# Patient Record
Sex: Female | Born: 1995 | Race: Black or African American | Hispanic: No | Smoking: Former smoker
Health system: Southern US, Community
[De-identification: ages and names within clinical notes are randomized; demographics above are authoritative.]

## PROBLEM LIST (undated history)

## (undated) DIAGNOSIS — Z8619 Personal history of other infectious and parasitic diseases: Secondary | ICD-10-CM

## (undated) DIAGNOSIS — F419 Anxiety disorder, unspecified: Secondary | ICD-10-CM

## (undated) DIAGNOSIS — F32A Depression, unspecified: Secondary | ICD-10-CM

## (undated) DIAGNOSIS — E739 Lactose intolerance, unspecified: Secondary | ICD-10-CM

## (undated) DIAGNOSIS — A64 Unspecified sexually transmitted disease: Secondary | ICD-10-CM

## (undated) DIAGNOSIS — H8110 Benign paroxysmal vertigo, unspecified ear: Secondary | ICD-10-CM

## (undated) DIAGNOSIS — J329 Chronic sinusitis, unspecified: Secondary | ICD-10-CM

## (undated) DIAGNOSIS — F329 Major depressive disorder, single episode, unspecified: Secondary | ICD-10-CM

## (undated) DIAGNOSIS — A609 Anogenital herpesviral infection, unspecified: Secondary | ICD-10-CM

## (undated) HISTORY — DX: Depression, unspecified: F32.A

## (undated) HISTORY — DX: Benign paroxysmal vertigo, unspecified ear: H81.10

## (undated) HISTORY — DX: Anogenital herpesviral infection, unspecified: A60.9

## (undated) HISTORY — DX: Unspecified sexually transmitted disease: A64

## (undated) HISTORY — DX: Personal history of other infectious and parasitic diseases: Z86.19

## (undated) HISTORY — PX: TOOTH EXTRACTION: SUR596

---
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
## 2014-02-20 ENCOUNTER — Emergency Department (HOSPITAL_COMMUNITY)
Admission: EM | Admit: 2014-02-20 | Discharge: 2014-02-20 | Disposition: A | Discharge status: Home / Self Care | Payer: Medicaid Other | Attending: Emergency Medicine | Admitting: Emergency Medicine

## 2014-02-20 ENCOUNTER — Encounter (HOSPITAL_COMMUNITY): Payer: Self-pay | Admitting: Emergency Medicine

## 2014-02-20 DIAGNOSIS — N390 Urinary tract infection, site not specified: Secondary | ICD-10-CM | POA: Insufficient documentation

## 2014-02-20 DIAGNOSIS — Z202 Contact with and (suspected) exposure to infections with a predominantly sexual mode of transmission: Secondary | ICD-10-CM | POA: Diagnosis present

## 2014-02-20 DIAGNOSIS — Z8709 Personal history of other diseases of the respiratory system: Secondary | ICD-10-CM | POA: Insufficient documentation

## 2014-02-20 DIAGNOSIS — Z7951 Long term (current) use of inhaled steroids: Secondary | ICD-10-CM | POA: Diagnosis not present

## 2014-02-20 DIAGNOSIS — M791 Myalgia: Secondary | ICD-10-CM | POA: Diagnosis not present

## 2014-02-20 DIAGNOSIS — Z720 Tobacco use: Secondary | ICD-10-CM | POA: Diagnosis not present

## 2014-02-20 DIAGNOSIS — Z79899 Other long term (current) drug therapy: Secondary | ICD-10-CM | POA: Diagnosis not present

## 2014-02-20 DIAGNOSIS — Z8639 Personal history of other endocrine, nutritional and metabolic disease: Secondary | ICD-10-CM | POA: Diagnosis not present

## 2014-02-20 DIAGNOSIS — Z880 Allergy status to penicillin: Secondary | ICD-10-CM | POA: Diagnosis not present

## 2014-02-20 DIAGNOSIS — N739 Female pelvic inflammatory disease, unspecified: Secondary | ICD-10-CM | POA: Insufficient documentation

## 2014-02-20 DIAGNOSIS — Z3202 Encounter for pregnancy test, result negative: Secondary | ICD-10-CM | POA: Diagnosis not present

## 2014-02-20 DIAGNOSIS — N730 Acute parametritis and pelvic cellulitis: Secondary | ICD-10-CM

## 2014-02-20 LAB — HIV ANTIBODY (ROUTINE TESTING W REFLEX): HIV: NONREACTIVE

## 2014-02-20 LAB — URINALYSIS, ROUTINE W REFLEX MICROSCOPIC
Bilirubin Urine: NEGATIVE
GLUCOSE, UA: NEGATIVE mg/dL
Ketones, ur: NEGATIVE mg/dL
Nitrite: NEGATIVE
PH: 5.5 (ref 5.0–8.0)
PROTEIN: NEGATIVE mg/dL
Specific Gravity, Urine: 1.02 (ref 1.005–1.030)
Urobilinogen, UA: 0.2 mg/dL (ref 0.0–1.0)

## 2014-02-20 LAB — COMPREHENSIVE METABOLIC PANEL
ALT: 18 U/L (ref 0–35)
AST: 20 U/L (ref 0–37)
Albumin: 3.8 g/dL (ref 3.5–5.2)
Alkaline Phosphatase: 69 U/L (ref 39–117)
Anion gap: 13 (ref 5–15)
BUN: 11 mg/dL (ref 6–23)
CALCIUM: 9.3 mg/dL (ref 8.4–10.5)
CO2: 24 mEq/L (ref 19–32)
Chloride: 101 mEq/L (ref 96–112)
Creatinine, Ser: 0.81 mg/dL (ref 0.50–1.10)
GFR calc non Af Amer: >90 mL/min (ref >90)
Glucose, Bld: 87 mg/dL (ref 70–99)
Potassium: 4 mEq/L (ref 3.7–5.3)
SODIUM: 138 meq/L (ref 137–147)
TOTAL PROTEIN: 7.8 g/dL (ref 6.0–8.3)
Total Bilirubin: 0.3 mg/dL (ref 0.3–1.2)

## 2014-02-20 LAB — WET PREP, GENITAL
Trich, Wet Prep: NONE SEEN
Yeast Wet Prep HPF POC: NONE SEEN

## 2014-02-20 LAB — CBC WITH DIFFERENTIAL/PLATELET
Basophils Absolute: 0.1 10*3/uL (ref 0.0–0.1)
Basophils Relative: 1 % (ref 0–1)
EOS ABS: 0 10*3/uL (ref 0.0–0.7)
Eosinophils Relative: 0 % (ref 0–5)
HCT: 39.3 % (ref 36.0–46.0)
Hemoglobin: 13.9 g/dL (ref 12.0–15.0)
Lymphocytes Relative: 15 % (ref 12–46)
Lymphs Abs: 1 10*3/uL (ref 0.7–4.0)
MCH: 29.8 pg (ref 26.0–34.0)
MCHC: 35.4 g/dL (ref 30.0–36.0)
MCV: 84.3 fL (ref 78.0–100.0)
Monocytes Absolute: 0.5 10*3/uL (ref 0.1–1.0)
Monocytes Relative: 7 % (ref 3–12)
NEUTROS PCT: 77 % (ref 43–77)
Neutro Abs: 5.1 10*3/uL (ref 1.7–7.7)
Platelets: 160 10*3/uL (ref 150–400)
RBC: 4.66 MIL/uL (ref 3.87–5.11)
RDW: 13.3 % (ref 11.5–15.5)
WBC: 6.7 10*3/uL (ref 4.0–10.5)

## 2014-02-20 LAB — URINE MICROSCOPIC-ADD ON

## 2014-02-20 LAB — LIPASE, BLOOD: LIPASE: 22 U/L (ref 11–59)

## 2014-02-20 LAB — POC URINE PREG, ED: PREG TEST UR: NEGATIVE

## 2014-02-20 LAB — RPR

## 2014-02-20 MED ORDER — LIDOCAINE HCL 2 % IJ SOLN
INTRAMUSCULAR | Status: AC
  Administered 2014-02-20: 42 mg
  Filled 2014-02-20: qty 20

## 2014-02-20 MED ORDER — DOXYCYCLINE HYCLATE 100 MG PO CAPS: 100.0000 mg | ORAL_CAPSULE | Freq: Two times a day (BID) | ORAL | Status: DC

## 2014-02-20 MED ORDER — ACETAMINOPHEN 325 MG PO TABS
650.0000 mg | ORAL_TABLET | Freq: Four times a day (QID) | ORAL | Status: DC | PRN
Start: 2014-02-20 — End: 2014-02-20
  Administered 2014-02-20: 650 mg via ORAL
  Filled 2014-02-20: qty 2

## 2014-02-20 MED ORDER — DOXYCYCLINE HYCLATE 100 MG PO TABS
100.0000 mg | ORAL_TABLET | Freq: Once | ORAL | Status: AC
  Administered 2014-02-20: 100 mg via ORAL
  Filled 2014-02-20: qty 1

## 2014-02-20 MED ORDER — CEFTRIAXONE SODIUM 250 MG IJ SOLR
250.0000 mg | Freq: Once | INTRAMUSCULAR | Status: AC
  Administered 2014-02-20: 250 mg via INTRAMUSCULAR
  Filled 2014-02-20: qty 250

## 2014-02-20 MED ORDER — SODIUM CHLORIDE 0.9 % IV BOLUS (SEPSIS): 1000.0000 mL | Freq: Once | INTRAVENOUS | Status: DC

## 2014-02-20 MED ORDER — IBUPROFEN 800 MG PO TABS: 800.0000 mg | ORAL_TABLET | Freq: Three times a day (TID) | ORAL | Status: DC

## 2014-02-20 MED ORDER — CEPHALEXIN 500 MG PO CAPS: 500.0000 mg | ORAL_CAPSULE | Freq: Four times a day (QID) | ORAL | Status: DC

## 2014-02-20 NOTE — ED Notes (Signed)
Patient medicated for fever, see MAR Patient stated to this nurse that she wants to be tested for STDs Patient's boyfriend is at the bedside--patient states that he is aware and that he is supposed to be tested as well Patient ambulatory to bathroom for urine specimen

## 2014-02-20 NOTE — Discharge Instructions (Signed)
Ibuprofen and tylenol for fever. Drink plenty of fluids. Doxycycline for pelvic infection until all gone. Keflex for bladder infection until all gone. Follow up in 2-3 days for recheck. Return if worsening.   Pelvic Inflammatory Disease Pelvic inflammatory disease (PID) refers to an infection in some or all of the female organs. The infection can be in the uterus, ovaries, fallopian tubes, or the surrounding tissues in the pelvis. PID can cause abdominal or pelvic pain that comes on suddenly (acute pelvic pain). PID is a serious infection because it can lead to lasting (chronic) pelvic pain or the inability to have children (infertile).  CAUSES  The infection is often caused by the normal bacteria found in the vaginal tissues. PID may also be caused by an infection that is spread during sexual contact. PID can also occur following:   The birth of a baby.   A miscarriage.   An abortion.   Major pelvic surgery.   The use of an intrauterine device (IUD).   A sexual assault.  RISK FACTORS Certain factors can put a person at higher risk for PID, such as:  Being younger than 25 years.  Being sexually active at [place] age.  Usingnonbarrier contraception.  Havingmultiple sexual partners.  Having sex with someone who has symptoms of a genital infection.  Using oral contraception. Other times, certain behaviors can increase the possibility of getting PID, such as:  Having sex during your period.  Using a vaginal douche.  Having an intrauterine device (IUD) in place. SYMPTOMS   Abdominal or pelvic pain.   Fever.   Chills.   Abnormal vaginal discharge.  Abnormal uterine bleeding.   Unusual pain shortly after finishing your period. DIAGNOSIS  Your caregiver will choose some of the following methods to make a diagnosis, such as:   Performinga physical exam and history. A pelvic exam typically reveals a very tender uterus and surrounding pelvis.   Ordering  laboratory tests including a pregnancy test, blood tests, and urine test.  Orderingcultures of the vagina and cervix to check for a sexually transmitted infection (STI).  Performing an ultrasound.   Performing a laparoscopic procedure to look inside the pelvis.  TREATMENT   Antibiotic medicines may be prescribed and taken by mouth.   Sexual partners may be treated when the infection is caused by a sexually transmitted disease (STD).   Hospitalization may be needed to give antibiotics intravenously.  Surgery may be needed, but this is rare. It may take weeks until you are completely well. If you are diagnosed with PID, you should also be checked for human immunodeficiency virus (HIV). HOME CARE INSTRUCTIONS   If given, take your antibiotics as directed. Finish the medicine even if you start to feel better.   Only take over-the-counter or prescription medicines for pain, discomfort, or fever as directed by your caregiver.   Do not have sexual intercourse until treatment is completed or as directed by your caregiver. If PID is confirmed, your recent sexual partner(s) will need treatment.   Keep your follow-up appointments. SEEK MEDICAL CARE IF:   You have increased or abnormal vaginal discharge.   You need prescription medicine for your pain.   You vomit.   You cannot take your medicines.   Your partner has an STD.  SEEK IMMEDIATE MEDICAL CARE IF:   You have a fever.   You have increased abdominal or pelvic pain.   You have chills.   You have pain when you urinate.   You are not  better after 72 hours following treatment.  MAKE SURE YOU:   Understand these instructions.  Will watch your condition.  Will get help right away if you are not doing well or get worse. Document Released: 04/03/2005 Document Revised: 07/29/2012 Document Reviewed: 03/30/2011 [facility] Patient Information 2015 [place], [place]. This information is not intended to replace  advice given to you by your health care provider. Make sure you discuss any questions you have with your health care provider.  Urinary Tract Infection Urinary tract infections (UTIs) can develop anywhere along your urinary tract. Your urinary tract is your body's drainage system for removing wastes and extra water. Your urinary tract includes two kidneys, two ureters, a bladder, and a urethra. Your kidneys are a pair of bean-shaped organs. Each kidney is about the size of your fist. They are located below your ribs, one on each side of your spine. CAUSES Infections are caused by microbes, which are microscopic organisms, including fungi, viruses, and bacteria. These organisms are so small that they can only be seen through a microscope. Bacteria are the microbes that most commonly cause UTIs. SYMPTOMS  Symptoms of UTIs may vary by age and gender of the patient and by the location of the infection. Symptoms in young women typically include a frequent and intense urge to urinate and a painful, burning feeling in the bladder or urethra during urination. Older women and men are more likely to be tired, shaky, and weak and have muscle aches and abdominal pain. A fever may mean the infection is in your kidneys. Other symptoms of a kidney infection include pain in your back or sides below the ribs, nausea, and vomiting. DIAGNOSIS To diagnose a UTI, your caregiver will ask you about your symptoms. Your caregiver also will ask to provide a urine sample. The urine sample will be tested for bacteria and white blood cells. White blood cells are made by your body to help fight infection. TREATMENT  Typically, UTIs can be treated with medication. Because most UTIs are caused by a bacterial infection, they usually can be treated with the use of antibiotics. The choice of antibiotic and length of treatment depend on your symptoms and the type of bacteria causing your infection. HOME CARE INSTRUCTIONS  If you were  prescribed antibiotics, take them exactly as your caregiver instructs you. Finish the medication even if you feel better after you have only taken some of the medication.  Drink enough water and fluids to keep your urine clear or pale yellow.  Avoid caffeine, tea, and carbonated beverages. They tend to irritate your bladder.  Empty your bladder often. Avoid holding urine for long periods of time.  Empty your bladder before and after sexual intercourse.  After a bowel movement, women should cleanse from front to back. Use each tissue only once. SEEK MEDICAL CARE IF:   You have back pain.  You develop a fever.  Your symptoms do not begin to resolve within 3 days. SEEK IMMEDIATE MEDICAL CARE IF:   You have severe back pain or lower abdominal pain.  You develop chills.  You have nausea or vomiting.  You have continued burning or discomfort with urination. MAKE SURE YOU:   Understand these instructions.  Will watch your condition.  Will get help right away if you are not doing well or get worse. Document Released: 01/11/2005 Document Revised: 10/03/2011 Document Reviewed: 05/12/2011 [facility] Patient Information 2015 [place], [place]. This information is not intended to replace advice given to you by your health care  provider. Make sure you discuss any questions you have with your health care provider. ° °

## 2014-02-20 NOTE — ED Provider Notes (Signed)
CSN: [id number]     Arrival date & time 02/20/14  1338 History   First MD Initiated Contact with Patient 02/20/14 1443     Chief Complaint  Patient presents with  . Fever  . Vaginal Bleeding  . Exposure to STD     (Consider location/radiation/quality/duration/timing/severity/associated sxs/prior Treatment) HPI Stacy Hudson is a 18 y.o. female who presents to ED with complaint of fever and chills. States over the last week, she has had dysuria and blood on tissue and in the commode when wiping. No blood in underwear. States she thought it was coming from her urethra. States today she had a sudden onset of chills and fever. Reports body aches. Denies URI symptoms. Denies abdominal pain or back pain. No n/v/d. Did not take any medications for this. Last menstrual cycle 3wks ago. Pt states that here, she urinated in the cup and saw no blood, then wiped and states now thinks bleeding is vaginal. Pt denies abnormal vaginal discharge, but states she was going to go get tested for STDs today anyway because had unprotected intercourse with her boyfriend.   Past Medical History  Diagnosis Date  . Lactose intolerance   . Sinusitis    Past Surgical History  Procedure Laterality Date  . Tooth extraction     History reviewed. No pertinent family history. History  Substance Use Topics  . Smoking status: Current Every Day Smoker    Types: Cigarettes  . Smokeless tobacco: Not on file  . Alcohol Use: No   OB History    No data available     Review of Systems  Constitutional: Positive for fever, chills and fatigue.  HENT: Negative.   Respiratory: Negative for cough, chest tightness and shortness of breath.   Cardiovascular: Negative for chest pain, palpitations and leg swelling.  Gastrointestinal: Negative for nausea, vomiting, abdominal pain and diarrhea.  Genitourinary: Positive for dysuria, hematuria and vaginal bleeding. Negative for flank pain, vaginal discharge, vaginal pain and  pelvic pain.  Musculoskeletal: Positive for myalgias. Negative for arthralgias, neck pain and neck stiffness.  Skin: Negative for rash.  Neurological: Negative for dizziness, weakness and headaches.  All other systems reviewed and are negative.     Allergies  Penicillins  Home Medications   Prior to Admission medications   Medication Sig Start Date End Date Taking? Authorizing Provider  ibuprofen (ADVIL,MOTRIN) 800 MG tablet Take 800 mg by mouth every 8 (eight) hours as needed for moderate pain.   Yes Historical Provider, MD  levonorgestrel (MIRENA) 20 MCG/24HR IUD 1 each by Intrauterine route once.   Yes Historical Provider, MD  acetaminophen (TYLENOL) 500 MG tablet Take 500 mg by mouth every 6 (six) hours as needed.    Historical Provider, MD  fluticasone (FLONASE) 50 MCG/ACT nasal spray Place 2 sprays into the nose daily. 10/08/11 10/07/12  Domenick GongAshley Mortenson, MD  Pseudoephedrine-Guaifenesin (MUCINEX D) [phone] MG TB12 Take 1 tablet by mouth 2 (two) times daily. 10/08/11   Domenick GongAshley Mortenson, MD  trimethoprim-polymyxin b (POLYTRIM) ophthalmic solution Place 2 drops into the right eye every 6 (six) hours. X 7 days 11/28/13   Jess BartersJennifer Lee H Presson, PA   BP 116/85 mmHg  Pulse 112  Temp(Src) 101.9 F (38.8 C) (Oral)  Resp 18  SpO2 100% Physical Exam  Constitutional: She is oriented to person, place, and time. She appears well-developed and well-nourished. No distress.  HENT:  Head: Normocephalic.  Eyes: Conjunctivae are normal.  Neck: Neck supple.  Cardiovascular: Normal rate, regular rhythm and  normal heart sounds.   Pulmonary/Chest: Effort normal and breath sounds normal. No respiratory distress. She has no wheezes. She has no rales.  Abdominal: Soft. Bowel sounds are normal. She exhibits no distension. There is no tenderness. There is no rebound.  No CVA tenderness bilaterally  Genitourinary:  Normal external genitalia. Normal vaginal canal.   white discharge. Cervix is friable,  eyrythematous, closed. positive CMT. Uterine tenderness. No adnexal tenderness. No masses palpated.    Musculoskeletal: She exhibits no edema.  Neurological: She is alert and oriented to person, place, and time.  Skin: Skin is warm and dry.  Psychiatric: She has a normal mood and affect. Her behavior is normal.  Nursing note and vitals reviewed.   ED Course  Procedures (including critical care time) Labs Review Labs Reviewed  WET PREP, GENITAL - Abnormal; Notable for the following:    Clue Cells Wet Prep HPF POC FEW (*)    WBC, Wet Prep HPF POC MODERATE (*)    All other components within normal limits  URINALYSIS, ROUTINE W REFLEX MICROSCOPIC - Abnormal; Notable for the following:    Hgb urine dipstick LARGE (*)    Leukocytes, UA SMALL (*)    All other components within normal limits  URINE MICROSCOPIC-ADD ON - Abnormal; Notable for the following:    Bacteria, UA FEW (*)    All other components within normal limits  GC/CHLAMYDIA PROBE AMP  CBC WITH DIFFERENTIAL  COMPREHENSIVE METABOLIC PANEL  LIPASE, BLOOD  RPR  HIV ANTIBODY (ROUTINE TESTING)  POC URINE PREG, ED    Imaging Review No results found.   EKG Interpretation None      MDM   Final diagnoses:  PID (acute pelvic inflammatory disease)  UTI (lower urinary tract infection)    Pt with urethral vs vaginal bleeding, fever, chills, generalized malaise. No URI symptoms, no meningismus. Febrile at 101.9, tylenol given. Pt refused IV. Will get pelvic exam, labs, UA.    Pt's pelvic exam concerning for cervicitis vs PID with CMT and uterine tenderness. No adnexal tenderness. Pt's temp down after tylenol. She is feeling better. UA infected. Suspect PID. Able to tolerate orals, non toxic, appropriate for outpatient tx trial. Received rocephin 250mg  IM. Home with doxycycline and keflex. Follow up closely with gyn.    Filed Vitals:   02/20/14 1345 02/20/14 1510 02/20/14 1710  BP: 116/85  100/55  Pulse: 112  95  Temp:  101.9 F (38.8 C) 99.9 F (37.7 C)   TempSrc: Oral Oral   Resp: 18  18  SpO2: 100%  100%     Lottie Musselatyana A Jabbar Palmero, PA-C 02/20/14 1848  Rolland PorterMark James, MD 03/04/14 [phone]

## 2014-02-20 NOTE — ED Notes (Signed)
Pt states she has been having urethral bleeding last night, worsening today. Pt states she has been having chills.

## 2014-02-20 NOTE — ED Notes (Signed)
Patient does not want PIV site placed, stated that she wants to "opt out" Labs obtained and sent, per orders

## 2014-02-20 NOTE — Progress Notes (Signed)
  CARE MANAGEMENT ED NOTE 02/20/2014  Patient:  Stacy Hudson,Stacy Hudson   Account Number:  [id number]  Date Initiated:  02/20/2014  Documentation initiated by:  Edd ArbourGIBBS,KIMBERLY  Subjective/Objective Assessment:   18 yr old medica[facility] [place] access urethral bleeding last night, worsening today. Pt states she has been having chills.        Subjective/Objective Assessment Detail:   [facility] [place]  Address: [address] [place], [place] [id number]  Telephone: [phone]     Action/Plan:   epic updated   Action/Plan Detail:   Anticipated DC Date:  02/20/2014     Status Recommendation to Physician:   Result of Recommendation:    Other ED Services  Consult Working Plan    DC Planning Services  Other  Outpatient Services - Pt will follow up  PCP issues    Choice offered to / List presented to:            Status of service:  Completed, signed off  ED Comments:   ED Comments Detail:

## 2014-02-20 NOTE — ED Notes (Signed)
EDP at bedside  

## 2014-02-20 NOTE — ED Notes (Signed)
Patient states that she is unsure if she is pregnant or not Patient has Mirena, but would still like for a urine pregnancy to be completed

## 2014-02-21 LAB — GC/CHLAMYDIA PROBE AMP
CT Probe RNA: NEGATIVE
GC PROBE AMP APTIMA: NEGATIVE

## 2014-07-11 ENCOUNTER — Emergency Department (HOSPITAL_COMMUNITY)
Admission: EM | Admit: 2014-07-11 | Discharge: 2014-07-12 | Disposition: A | Discharge status: Home / Self Care | Payer: Medicaid Other | Attending: Emergency Medicine | Admitting: Emergency Medicine

## 2014-07-11 ENCOUNTER — Emergency Department (HOSPITAL_COMMUNITY): Payer: Medicaid Other

## 2014-07-11 ENCOUNTER — Encounter (HOSPITAL_COMMUNITY): Payer: Self-pay | Admitting: Emergency Medicine

## 2014-07-11 DIAGNOSIS — Z8709 Personal history of other diseases of the respiratory system: Secondary | ICD-10-CM | POA: Insufficient documentation

## 2014-07-11 DIAGNOSIS — Z792 Long term (current) use of antibiotics: Secondary | ICD-10-CM | POA: Diagnosis not present

## 2014-07-11 DIAGNOSIS — Z79899 Other long term (current) drug therapy: Secondary | ICD-10-CM | POA: Diagnosis not present

## 2014-07-11 DIAGNOSIS — R1032 Left lower quadrant pain: Secondary | ICD-10-CM | POA: Diagnosis present

## 2014-07-11 DIAGNOSIS — Z8639 Personal history of other endocrine, nutritional and metabolic disease: Secondary | ICD-10-CM | POA: Diagnosis not present

## 2014-07-11 DIAGNOSIS — Z3202 Encounter for pregnancy test, result negative: Secondary | ICD-10-CM | POA: Insufficient documentation

## 2014-07-11 DIAGNOSIS — Z720 Tobacco use: Secondary | ICD-10-CM | POA: Diagnosis not present

## 2014-07-11 DIAGNOSIS — Z880 Allergy status to penicillin: Secondary | ICD-10-CM | POA: Insufficient documentation

## 2014-07-11 DIAGNOSIS — N898 Other specified noninflammatory disorders of vagina: Secondary | ICD-10-CM | POA: Diagnosis not present

## 2014-07-11 DIAGNOSIS — N8329 Other ovarian cysts: Secondary | ICD-10-CM | POA: Insufficient documentation

## 2014-07-11 DIAGNOSIS — N83209 Unspecified ovarian cyst, unspecified side: Secondary | ICD-10-CM

## 2014-07-11 LAB — URINE MICROSCOPIC-ADD ON

## 2014-07-11 LAB — URINALYSIS, ROUTINE W REFLEX MICROSCOPIC
BILIRUBIN URINE: NEGATIVE
Glucose, UA: NEGATIVE mg/dL
Ketones, ur: 15 mg/dL — AB
Nitrite: POSITIVE — AB
PH: 5.5 (ref 5.0–8.0)
PROTEIN: NEGATIVE mg/dL
Specific Gravity, Urine: 1.029 (ref 1.005–1.030)
Urobilinogen, UA: 0.2 mg/dL (ref 0.0–1.0)

## 2014-07-11 LAB — POC URINE PREG, ED: Preg Test, Ur: NEGATIVE

## 2014-07-11 NOTE — ED Provider Notes (Signed)
CSN: [id number]     Arrival date & time 07/11/14  2046 History   First MD Initiated Contact with Patient 07/11/14 2157     Chief Complaint  Patient presents with  . Abdominal Cramping     (Consider location/radiation/quality/duration/timing/severity/associated sxs/prior Treatment) Patient is a 19 y.o. female presenting with abdominal pain.  Abdominal Pain Pain location:  LLQ and suprapubic Pain quality: cramping   Pain radiates to:  Does not radiate Pain severity:  Moderate Onset quality:  Gradual Duration:  1 week Timing:  Constant Progression:  Unchanged Chronicity:  Recurrent Context comment:  Has intermittent cramping, current symptoms lasting longer than normal Relieved by:  Nothing Worsened by:  Movement and palpation Ineffective treatments:  None tried Associated symptoms: vaginal discharge   Associated symptoms: no anorexia, no fever, no nausea, no vaginal bleeding and no vomiting     Past Medical History  Diagnosis Date  . Lactose intolerance   . Sinusitis    Past Surgical History  Procedure Laterality Date  . Tooth extraction     No family history on file. History  Substance Use Topics  . Smoking status: Current Every Day Smoker    Types: Cigarettes  . Smokeless tobacco: Not on file  . Alcohol Use: No   OB History    No data available     Review of Systems  Constitutional: Negative for fever.  Gastrointestinal: Positive for abdominal pain. Negative for nausea, vomiting and anorexia.  Genitourinary: Positive for vaginal discharge. Negative for vaginal bleeding.  All other systems reviewed and are negative.     Allergies  Penicillins  Home Medications   Prior to Admission medications   Medication Sig Start Date End Date Taking? Authorizing Provider  acetaminophen (TYLENOL) 500 MG tablet Take 500 mg by mouth every 6 (six) hours as needed.    Historical Provider, MD  cephALEXin (KEFLEX) 500 MG capsule Take 1 capsule (500 mg total) by mouth 4  (four) times daily. 02/20/14   Tatyana Kirichenko, PA-C  doxycycline (VIBRAMYCIN) 100 MG capsule Take 1 capsule (100 mg total) by mouth 2 (two) times daily. 07/12/14   Mirian Mo, MD  fluticasone (FLONASE) 50 MCG/ACT nasal spray Place 2 sprays into the nose daily. 10/08/11 10/07/12  Domenick Gong, MD  ibuprofen (ADVIL,MOTRIN) 800 MG tablet Take 1 tablet (800 mg total) by mouth 3 (three) times daily. 02/20/14   Jaynie Crumble, PA-C  levonorgestrel (MIRENA) 20 MCG/24HR IUD 1 each by Intrauterine route once.    Historical Provider, MD  metroNIDAZOLE (FLAGYL) 500 MG tablet Take 1 tablet (500 mg total) by mouth 2 (two) times daily. One po bid x 7 days 07/12/14   Mirian Mo, MD  Pseudoephedrine-Guaifenesin [facility] D) [phone] MG TB12 Take 1 tablet by mouth 2 (two) times daily. 10/08/11   Domenick Gong, MD  trimethoprim-polymyxin b (POLYTRIM) ophthalmic solution Place 2 drops into the right eye every 6 (six) hours. X 7 days 11/28/13   Jess Barters H Presson, PA   BP 114/65 mmHg  Pulse 74  Temp(Src) 98.4 F (36.9 C) (Oral)  Resp 12  Ht 5' 5.5" (1.664 m)  Wt 182 lb (82.555 kg)  BMI 29.82 kg/m2  SpO2 100% Physical Exam  Constitutional: She is oriented to person, place, and time. She appears well-developed and well-nourished.  HENT:  Head: Normocephalic and atraumatic.  Right Ear: External ear normal.  Left Ear: External ear normal.  Eyes: Conjunctivae and EOM are normal. Pupils are equal, round, and reactive to light.  Neck: Normal  range of motion. Neck supple.  Cardiovascular: Normal rate, regular rhythm, normal heart sounds and intact distal pulses.   Pulmonary/Chest: Effort normal and breath sounds normal.  Abdominal: Soft. Bowel sounds are normal. There is no tenderness.  Genitourinary: Cervix exhibits discharge (white). Cervix exhibits no motion tenderness and no friability. Right adnexum displays no mass, no tenderness and no fullness. Left adnexum displays tenderness. Left  adnexum displays no mass and no fullness.  Musculoskeletal: Normal range of motion.  Neurological: She is alert and oriented to person, place, and time.  Skin: Skin is warm and dry.  Vitals reviewed.   ED Course  Procedures (including critical care time) Labs Review Labs Reviewed  WET PREP, GENITAL - Abnormal; Notable for the following:    Clue Cells Wet Prep HPF POC FEW (*)    WBC, Wet Prep HPF POC FEW (*)    All other components within normal limits  URINALYSIS, ROUTINE W REFLEX MICROSCOPIC - Abnormal; Notable for the following:    Color, Urine AMBER (*)    APPearance CLOUDY (*)    Hgb urine dipstick TRACE (*)    Ketones, ur 15 (*)    Nitrite POSITIVE (*)    Leukocytes, UA SMALL (*)    All other components within normal limits  URINE MICROSCOPIC-ADD ON - Abnormal; Notable for the following:    Bacteria, UA MANY (*)    All other components within normal limits  POC URINE PREG, ED  GC/CHLAMYDIA PROBE AMP ([place])    Imaging Review [place] Transvaginal Non-ob  07/12/2014   CLINICAL DATA:  Left lower quadrant pain and cramping for 1 week.  EXAM: TRANSABDOMINAL AND TRANSVAGINAL ULTRASOUND OF PELVIS  TECHNIQUE: Both transabdominal and transvaginal ultrasound examinations of the pelvis were performed. Transabdominal technique was performed for global imaging of the pelvis including uterus, ovaries, adnexal regions, and pelvic cul-de-sac. It was necessary to proceed with endovaginal exam following the transabdominal exam to visualize the uterus, endometrium, right and left ovary.  COMPARISON:  Pelvic ultrasound 07/16/2008  FINDINGS: Uterus  Measurements: 6.9 x 2.7 x 4.4 cm. No fibroids or other mass visualized.  Endometrium  Thickness: 3.2 mm. Shadowing echogenic linear intrauterine device in place. Trace fluid in the cervical canal.  Right ovary  Measurements: 3.8 x 2.1 x 2.5 cm, volume of 10.6 cc. Multiple physiologic follicles, peripherally distributed. No adnexal mass. There is normal  blood flow.  Left ovary  Measurements: 4.3 x 2.4 x 3.1 cm. There is a complex 2.8 x 2.1 x 2.6 cm cyst with some peripheral blood flow, suggestive of involuting corpus luteal cyst. Normal blood flow seen to the ovarian parenchyma.  Other findings  Small volume of simple free fluid in the left adnexa and cul-de-sac.  IMPRESSION: 1. Small complex left ovarian cyst measuring 2.8 cm, likely an involuting corpus luteum. There is normal blood flow. 2. Peripheral distribution of follicles in the right ovary, has been described with polycystic ovarian syndrome, however ovarian volume is normal. 3. Intrauterine device appropriately positioned in the endometrium.   Electronically Signed   By: Rubye OaksMelanie  Ehinger M.D.   On: 07/12/2014 01:29   [place] Pelvis Complete  07/12/2014   CLINICAL DATA:  Left lower quadrant pain and cramping for 1 week.  EXAM: TRANSABDOMINAL AND TRANSVAGINAL ULTRASOUND OF PELVIS  TECHNIQUE: Both transabdominal and transvaginal ultrasound examinations of the pelvis were performed. Transabdominal technique was performed for global imaging of the pelvis including uterus, ovaries, adnexal regions, and pelvic cul-de-sac. It was necessary to proceed with  endovaginal exam following the transabdominal exam to visualize the uterus, endometrium, right and left ovary.  COMPARISON:  Pelvic ultrasound 07/16/2008  FINDINGS: Uterus  Measurements: 6.9 x 2.7 x 4.4 cm. No fibroids or other mass visualized.  Endometrium  Thickness: 3.2 mm. Shadowing echogenic linear intrauterine device in place. Trace fluid in the cervical canal.  Right ovary  Measurements: 3.8 x 2.1 x 2.5 cm, volume of 10.6 cc. Multiple physiologic follicles, peripherally distributed. No adnexal mass. There is normal blood flow.  Left ovary  Measurements: 4.3 x 2.4 x 3.1 cm. There is a complex 2.8 x 2.1 x 2.6 cm cyst with some peripheral blood flow, suggestive of involuting corpus luteal cyst. Normal blood flow seen to the ovarian parenchyma.  Other findings   Small volume of simple free fluid in the left adnexa and cul-de-sac.  IMPRESSION: 1. Small complex left ovarian cyst measuring 2.8 cm, likely an involuting corpus luteum. There is normal blood flow. 2. Peripheral distribution of follicles in the right ovary, has been described with polycystic ovarian syndrome, however ovarian volume is normal. 3. Intrauterine device appropriately positioned in the endometrium.   Electronically Signed   By: Rubye Oaks M.D.   On: 07/12/2014 01:29     EKG Interpretation None      MDM   Final diagnoses:  LLQ abdominal pain  Rupture of ovarian cyst    19 y.o. female with pertinent PMH of prior menstrual cramping presents with recurrent abd cramping x 5 days.  Pain worse intermittently, associated with brown vaginal dc.  Physical exam as above.  No cervical friability, however L adnexal tenderness associated with white dc.  Treated with rocephin/azithro.  [place] as above with cysts, otherwise unremarkable.  Doxycycline prescribed.  DC home in stable condition.    I have reviewed all laboratory and imaging studies if ordered as above  1. Rupture of ovarian cyst   2. LLQ abdominal pain         Mirian Mo, MD 07/12/14 1454

## 2014-07-11 NOTE — ED Notes (Signed)
Pt presents with left lower abdominal cramping for the past week- admits to brown vaginal discharge, denies bleeding.  Pt has Mirena since July 2015.  Denies N/V/D.  Denies urinary symptoms.

## 2014-07-12 LAB — WET PREP, GENITAL
Trich, Wet Prep: NONE SEEN
YEAST WET PREP: NONE SEEN

## 2014-07-12 MED ORDER — DOXYCYCLINE HYCLATE 100 MG PO CAPS: 100.0000 mg | ORAL_CAPSULE | Freq: Two times a day (BID) | ORAL | Status: DC

## 2014-07-12 MED ORDER — LIDOCAINE HCL (PF) 1 % IJ SOLN
INTRAMUSCULAR | Status: AC
  Filled 2014-07-12: qty 5

## 2014-07-12 MED ORDER — METRONIDAZOLE 500 MG PO TABS: 500.0000 mg | ORAL_TABLET | Freq: Two times a day (BID) | ORAL | Status: DC

## 2014-07-12 MED ORDER — CEFTRIAXONE SODIUM 250 MG IJ SOLR
250.0000 mg | Freq: Once | INTRAMUSCULAR | Status: AC
  Administered 2014-07-12: 250 mg via INTRAMUSCULAR
  Filled 2014-07-12: qty 250

## 2014-07-12 MED ORDER — AZITHROMYCIN 250 MG PO TABS
1000.0000 mg | ORAL_TABLET | Freq: Once | ORAL | Status: AC
  Administered 2014-07-12: 1000 mg via ORAL
  Filled 2014-07-12: qty 4

## 2014-07-12 MED ORDER — LIDOCAINE HCL (PF) 1 % IJ SOLN
5.0000 mL | Freq: Once | INTRAMUSCULAR | Status: AC
  Administered 2014-07-12: 5 mL

## 2014-07-12 NOTE — Discharge Instructions (Signed)
Abdominal Pain, Women °Abdominal (stomach, pelvic, or belly) pain can be caused by many things. It is important to tell your doctor: °· The location of the pain. °· Does it come and go or is it present all the time? °· Are there things that start the pain (eating certain foods, exercise)? °· Are there other symptoms associated with the pain (fever, nausea, vomiting, diarrhea)? °All of this is helpful to know when trying to find the cause of the pain. °CAUSES  °· Stomach: virus or bacteria infection, or ulcer. °· Intestine: appendicitis (inflamed appendix), regional ileitis (Crohn's disease), ulcerative colitis (inflamed colon), irritable bowel syndrome, diverticulitis (inflamed diverticulum of the colon), or cancer of the stomach or intestine. °· Gallbladder disease or stones in the gallbladder. °· Kidney disease, kidney stones, or infection. °· Pancreas infection or cancer. °· Fibromyalgia (pain disorder). °· Diseases of the female organs: °¨ Uterus: fibroid (non-cancerous) tumors or infection. °¨ Fallopian tubes: infection or tubal pregnancy. °¨ Ovary: cysts or tumors. °¨ Pelvic adhesions (scar tissue). °¨ Endometriosis (uterus lining tissue growing in the pelvis and on the pelvic organs). °¨ Pelvic congestion syndrome (female organs filling up with blood just before the menstrual period). °¨ Pain with the menstrual period. °¨ Pain with ovulation (producing an egg). °¨ Pain with an IUD (intrauterine device, birth control) in the uterus. °¨ Cancer of the female organs. °· Functional pain (pain not caused by a disease, may improve without treatment). °· Psychological pain. °· Depression. °DIAGNOSIS  °Your doctor will decide the seriousness of your pain by doing an examination. °· Blood tests. °· X-rays. °· Ultrasound. °· CT scan (computed tomography, special type of X-ray). °· MRI (magnetic resonance imaging). °· Cultures, for infection. °· Barium enema (dye inserted in the large intestine, to better view it with  X-rays). °· Colonoscopy (looking in intestine with a lighted tube). °· Laparoscopy (minor surgery, looking in abdomen with a lighted tube). °· Major abdominal exploratory surgery (looking in abdomen with a large incision). °TREATMENT  °The treatment will depend on the cause of the pain.  °· Many cases can be observed and treated at home. °· Over-the-counter medicines recommended by your caregiver. °· Prescription medicine. °· Antibiotics, for infection. °· Birth control pills, for painful periods or for ovulation pain. °· Hormone treatment, for endometriosis. °· Nerve blocking injections. °· Physical therapy. °· Antidepressants. °· Counseling with a psychologist or psychiatrist. °· Minor or major surgery. °HOME CARE INSTRUCTIONS  °· Do not take laxatives, unless directed by your caregiver. °· Take over-the-counter pain medicine only if ordered by your caregiver. Do not take aspirin because it can cause an upset stomach or bleeding. °· Try a clear liquid diet (broth or water) as ordered by your caregiver. Slowly move to a bland diet, as tolerated, if the pain is related to the stomach or intestine. °· Have a thermometer and take your temperature several times a day, and record it. °· Bed rest and sleep, if it helps the pain. °· Avoid sexual intercourse, if it causes pain. °· Avoid stressful situations. °· Keep your follow-up appointments and tests, as your caregiver orders. °· If the pain does not go away with medicine or surgery, you may try: °¨ Acupuncture. °¨ Relaxation exercises (yoga, meditation). °¨ Group therapy. °¨ Counseling. °SEEK MEDICAL CARE IF:  °· You notice certain foods cause stomach pain. °· Your home care treatment is not helping your pain. °· You need stronger pain medicine. °· You want your IUD removed. °· You feel faint or   lightheaded. °· You develop nausea and vomiting. °· You develop a rash. °· You are having side effects or an allergy to your medicine. °SEEK IMMEDIATE MEDICAL CARE IF:  °· Your  pain does not go away or gets worse. °· You have a fever. °· Your pain is felt only in portions of the abdomen. The right side could possibly be appendicitis. The left lower portion of the abdomen could be colitis or diverticulitis. °· You are passing blood in your stools (bright red or black tarry stools, with or without vomiting). °· You have blood in your urine. °· You develop chills, with or without a fever. °· You pass out. °MAKE SURE YOU:  °· Understand these instructions. °· Will watch your condition. °· Will get help right away if you are not doing well or get worse. °Document Released: 01/29/2007 Document Revised: 08/18/2013 Document Reviewed: 02/18/2009 °Exit[employer]® Patient Information ©2015 [employer]. This information is not intended to replace advice given to you by your health care provider. Make sure you discuss any questions you have with your health care provider. ° °

## 2014-07-13 LAB — GC/CHLAMYDIA PROBE AMP (~~LOC~~) NOT AT ARMC
Chlamydia: NEGATIVE
Neisseria Gonorrhea: NEGATIVE

## 2014-07-25 ENCOUNTER — Emergency Department (INDEPENDENT_AMBULATORY_CARE_PROVIDER_SITE_OTHER)
Admission: EM | Admit: 2014-07-25 | Discharge: 2014-07-25 | Disposition: A | Discharge status: Home / Self Care | Payer: Medicaid Other | Source: Home / Self Care | Attending: Family Medicine | Admitting: Family Medicine

## 2014-07-25 ENCOUNTER — Encounter (HOSPITAL_COMMUNITY): Payer: Self-pay | Admitting: Emergency Medicine

## 2014-07-25 DIAGNOSIS — B009 Herpesviral infection, unspecified: Secondary | ICD-10-CM

## 2014-07-25 MED ORDER — VALACYCLOVIR HCL 1 G PO TABS: 1000.0000 mg | ORAL_TABLET | Freq: Three times a day (TID) | ORAL | Status: DC

## 2014-07-25 MED ORDER — ACYCLOVIR 5 % EX OINT: 1.0000 "application " | TOPICAL_OINTMENT | CUTANEOUS | Status: DC

## 2014-07-25 NOTE — Discharge Instructions (Signed)
Herpes Simplex Herpes simplex is generally classified as Type 1 or Type 2. Type 1 is generally the type that is responsible for cold sores. Type 2 is generally associated with sexually transmitted diseases. We now know that most of the thoughts on these viruses are inaccurate. We find that HSV1 is also present genitally and HSV2 can be present orally, but this will vary in different locations of the world. Herpes simplex is usually detected by doing a culture. Blood tests are also available for this virus; however, the accuracy is often not as good.  PREPARATION FOR TEST No preparation or fasting is necessary. NORMAL FINDINGS  No virus present  No HSV antigens or antibodies present Ranges for normal findings may vary among different laboratories and hospitals. You should always check with your doctor after having lab work or other tests done to discuss the meaning of your test results and whether your values are considered within normal limits. MEANING OF TEST  Your caregiver will go over the test results with you and discuss the importance and meaning of your results, as well as treatment options and the need for additional tests if necessary. OBTAINING THE TEST RESULTS  It is your responsibility to obtain your test results. Ask the lab or department performing the test when and how you will get your results. Document Released: 05/06/2004 Document Revised: 06/26/2011 Document Reviewed: 03/14/2008 [employer] Patient Information 2015 [employer]. This information is not intended to replace advice given to you by your health care provider. Make sure you discuss any questions you have with your health care provider.  

## 2014-07-25 NOTE — ED Provider Notes (Signed)
CSN: [id number]     Arrival date & time 07/25/14  1219 History   First MD Initiated Contact with Patient 07/25/14 1259     Chief Complaint  Patient presents with  . Rash   (Consider location/radiation/quality/duration/timing/severity/associated sxs/prior Treatment) HPI Comments: 19 year old female developed an itchy red papular vesicular rash to the right lateral hip 1 week ago. Started out as red papules on her red base and is now white Lesions over a red base. She states that it is itchy and she scratches the home and a fluid comes out from the. There Are No Other Areas Affected.   Past Medical History  Diagnosis Date  . Lactose intolerance   . Sinusitis    Past Surgical History  Procedure Laterality Date  . Tooth extraction     History reviewed. No pertinent family history. History  Substance Use Topics  . Smoking status: Current Every Day Smoker    Types: Cigarettes  . Smokeless tobacco: Not on file  . Alcohol Use: No   OB History    No data available     Review of Systems  Constitutional: Negative.   Skin: Positive for rash.  All other systems reviewed and are negative.   Allergies  Penicillins  Home Medications   Prior to Admission medications   Medication Sig Start Date End Date Taking? Authorizing Provider  doxycycline (VIBRAMYCIN) 100 MG capsule Take 1 capsule (100 mg total) by mouth 2 (two) times daily. 07/12/14  Yes Mirian MoMatthew Gentry, MD  acetaminophen (TYLENOL) 500 MG tablet Take 500 mg by mouth every 6 (six) hours as needed.    Historical Provider, MD  acyclovir ointment (ZOVIRAX) 5 % Apply 1 application topically every 3 (three) hours. 07/25/14   Hayden Rasmussenavid Vallerie Hentz, NP  cephALEXin (KEFLEX) 500 MG capsule Take 1 capsule (500 mg total) by mouth 4 (four) times daily. 02/20/14   Tatyana Kirichenko, PA-C  fluticasone (FLONASE) 50 MCG/ACT nasal spray Place 2 sprays into the nose daily. 10/08/11 10/07/12  Domenick GongAshley Mortenson, MD  ibuprofen (ADVIL,MOTRIN) 800 MG tablet Take 1  tablet (800 mg total) by mouth 3 (three) times daily. 02/20/14   Jaynie Crumbleatyana Kirichenko, PA-C  levonorgestrel (MIRENA) 20 MCG/24HR IUD 1 each by Intrauterine route once.    Historical Provider, MD  metroNIDAZOLE (FLAGYL) 500 MG tablet Take 1 tablet (500 mg total) by mouth 2 (two) times daily. One po bid x 7 days 07/12/14   Mirian MoMatthew Gentry, MD  Pseudoephedrine-Guaifenesin [facility](MUCINEX D) [phone] MG TB12 Take 1 tablet by mouth 2 (two) times daily. 10/08/11   Domenick GongAshley Mortenson, MD  trimethoprim-polymyxin b (POLYTRIM) ophthalmic solution Place 2 drops into the right eye every 6 (six) hours. X 7 days 11/28/13   Ria ClockJennifer Lee H Presson, PA  valACYclovir (VALTREX) 1000 MG tablet Take 1 tablet (1,000 mg total) by mouth 3 (three) times daily. 07/25/14   Hayden Rasmussenavid Frannie Shedrick, NP   BP 101/62 mmHg  Pulse 87  Temp(Src) 98.2 F (36.8 C) (Oral)  Resp 16  SpO2 98% Physical Exam  Constitutional: She is oriented to person, place, and time. She appears well-developed and well-nourished. No distress.  Pulmonary/Chest: Effort normal. No respiratory distress.  Neurological: She is alert and oriented to person, place, and time. She exhibits normal muscle tone.  Skin: Skin is warm and dry.  There is an approximately 3 cm x 0.75 cm roughly rectangular shaped area of a papulovesicular rash now with white caps over red base to the right lateral hip.  Psychiatric: She has a normal mood and  affect.  Nursing note and vitals reviewed.   ED Course  Procedures (including critical care time) Labs Review Labs Reviewed  HERPES SIMPLEX VIRUS CULTURE    Imaging Review No results found.   MDM   1. HSV (herpes simplex virus) infection    Acyclovir oint as dir Valtrex t gm for 7 d HSV cult pending    Hayden Rasmussen, NP 07/25/14 1354

## 2014-07-25 NOTE — ED Notes (Signed)
C/o  Blistery rash on upper right hip since last Saturday.  States symptoms started out with just redness and a couple of bumps.   Mild irritation.

## 2014-07-27 LAB — HERPES SIMPLEX VIRUS CULTURE
Culture: DETECTED
SPECIAL REQUESTS: NORMAL

## 2014-07-31 ENCOUNTER — Telehealth (HOSPITAL_COMMUNITY): Payer: Self-pay | Admitting: *Deleted

## 2014-07-31 NOTE — ED Notes (Signed)
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  I called pt. Pt. verified x 2 and given result.  Pt. told she was adequately treated with Valtrex and Zovirax ointment.  Instructed to get treated for each outbreak with Acyclovir or Valtrex.  Tell your PCP, they can give you a 1 yr Rx. to fill at the beginning of each outbreak or give you suppressive therapy if needed. Stacy Hudson, Stacy Hudson 07/31/2014

## 2014-08-26 ENCOUNTER — Emergency Department (HOSPITAL_COMMUNITY)
Admission: EM | Admit: 2014-08-26 | Discharge: 2014-08-26 | Discharge status: Absent without leave | Payer: Medicaid Other | Source: Home / Self Care

## 2015-05-02 ENCOUNTER — Encounter (HOSPITAL_COMMUNITY): Payer: Self-pay | Admitting: Emergency Medicine

## 2015-05-02 DIAGNOSIS — Z8639 Personal history of other endocrine, nutritional and metabolic disease: Secondary | ICD-10-CM | POA: Insufficient documentation

## 2015-05-02 DIAGNOSIS — Z3202 Encounter for pregnancy test, result negative: Secondary | ICD-10-CM | POA: Insufficient documentation

## 2015-05-02 DIAGNOSIS — Z793 Long term (current) use of hormonal contraceptives: Secondary | ICD-10-CM | POA: Insufficient documentation

## 2015-05-02 DIAGNOSIS — N3090 Cystitis, unspecified without hematuria: Secondary | ICD-10-CM | POA: Insufficient documentation

## 2015-05-02 DIAGNOSIS — Z7951 Long term (current) use of inhaled steroids: Secondary | ICD-10-CM | POA: Insufficient documentation

## 2015-05-02 DIAGNOSIS — Z880 Allergy status to penicillin: Secondary | ICD-10-CM | POA: Insufficient documentation

## 2015-05-02 DIAGNOSIS — Z8709 Personal history of other diseases of the respiratory system: Secondary | ICD-10-CM | POA: Insufficient documentation

## 2015-05-02 DIAGNOSIS — Z87891 Personal history of nicotine dependence: Secondary | ICD-10-CM | POA: Insufficient documentation

## 2015-05-02 NOTE — ED Notes (Addendum)
C/o R flank pain x 1 week.  Pain has now moved to only on L flank.  Reports mild pain with urination.  States she did have UTI symptoms 2 weeks ago that have resolved without antibiotics. Denies fever.

## 2015-05-03 ENCOUNTER — Emergency Department (HOSPITAL_COMMUNITY)
Admission: EM | Admit: 2015-05-03 | Discharge: 2015-05-03 | Disposition: A | Discharge status: Home / Self Care | Payer: Medicaid Other | Attending: Emergency Medicine | Admitting: Emergency Medicine

## 2015-05-03 ENCOUNTER — Encounter (HOSPITAL_COMMUNITY): Payer: Self-pay | Admitting: Emergency Medicine

## 2015-05-03 DIAGNOSIS — N3090 Cystitis, unspecified without hematuria: Secondary | ICD-10-CM

## 2015-05-03 LAB — URINALYSIS, ROUTINE W REFLEX MICROSCOPIC
Bilirubin Urine: NEGATIVE
Glucose, UA: NEGATIVE mg/dL
Ketones, ur: NEGATIVE mg/dL
Nitrite: NEGATIVE
PH: 5 (ref 5.0–8.0)
Protein, ur: 30 mg/dL — AB
SPECIFIC GRAVITY, URINE: 1.015 (ref 1.005–1.030)

## 2015-05-03 LAB — POC URINE PREG, ED: Preg Test, Ur: NEGATIVE

## 2015-05-03 LAB — URINE MICROSCOPIC-ADD ON

## 2015-05-03 MED ORDER — NITROFURANTOIN MONOHYD MACRO 100 MG PO CAPS: 100.0000 mg | ORAL_CAPSULE | Freq: Two times a day (BID) | ORAL | Status: DC

## 2015-05-03 MED ORDER — PHENAZOPYRIDINE HCL 100 MG PO TABS
200.0000 mg | ORAL_TABLET | Freq: Once | ORAL | Status: AC
  Administered 2015-05-03: 200 mg via ORAL
  Filled 2015-05-03: qty 2

## 2015-05-03 MED ORDER — NITROFURANTOIN MONOHYD MACRO 100 MG PO CAPS
100.0000 mg | ORAL_CAPSULE | Freq: Once | ORAL | Status: AC
  Administered 2015-05-03: 100 mg via ORAL
  Filled 2015-05-03: qty 1

## 2015-05-03 MED ORDER — PHENAZOPYRIDINE HCL 200 MG PO TABS: 200.0000 mg | ORAL_TABLET | Freq: Three times a day (TID) | ORAL | Status: DC

## 2015-05-03 NOTE — ED Notes (Signed)
Waiting on Macrobid from Pharmacy

## 2015-05-03 NOTE — Discharge Instructions (Signed)

## 2015-05-03 NOTE — ED Provider Notes (Signed)
CSN: [id number]     Arrival date & time 05/02/15  2330 History   First MD Initiated Contact with Patient 05/03/15 [phone]     Chief Complaint  Patient presents with  . Flank Pain     (Consider location/radiation/quality/duration/timing/severity/associated sxs/prior Treatment) Patient is a 20 y.o. female presenting with dysuria.  Dysuria Pain quality:  Burning Duration:  2 weeks Timing:  Constant Progression:  Unchanged Chronicity:  Recurrent Recent urinary tract infections: no   Relieved by:  Nothing Worsened by:  Nothing tried Ineffective treatments:  None tried Urinary symptoms: no discolored urine   Associated symptoms: no fever   Associated symptoms comment:  Low back pain B Risk factors: sexually active   Risk factors: no hx of urolithiasis     Past Medical History  Diagnosis Date  . Lactose intolerance   . Sinusitis    Past Surgical History  Procedure Laterality Date  . Tooth extraction     No family history on file. Social History  Substance Use Topics  . Smoking status: Former Smoker    Types: Cigarettes    Quit date: 03/02/2015  . Smokeless tobacco: None  . Alcohol Use: No   OB History    No data available     Review of Systems  Constitutional: Negative for fever.  Genitourinary: Positive for dysuria and frequency.  All other systems reviewed and are negative.     Allergies  Penicillins  Home Medications   Prior to Admission medications   Medication Sig Start Date End Date Taking? Authorizing Provider  acetaminophen (TYLENOL) 500 MG tablet Take 500 mg by mouth every 6 (six) hours as needed for mild pain.    Yes Historical Provider, MD  docusate sodium (COLACE) 100 MG capsule Take 100 mg by mouth daily as needed for mild constipation.   Yes Historical Provider, MD  levonorgestrel (MIRENA) 20 MCG/24HR IUD 1 each by Intrauterine route once.   Yes Historical Provider, MD  acyclovir ointment (ZOVIRAX) 5 % Apply 1 application topically every 3  (three) hours. Patient not taking: Reported on 05/03/2015 07/25/14   Hayden Rasmussenavid Mabe, NP  cephALEXin (KEFLEX) 500 MG capsule Take 1 capsule (500 mg total) by mouth 4 (four) times daily. Patient not taking: Reported on 05/03/2015 02/20/14   Tatyana Kirichenko, PA-C  doxycycline (VIBRAMYCIN) 100 MG capsule Take 1 capsule (100 mg total) by mouth 2 (two) times daily. Patient not taking: Reported on 05/03/2015 07/12/14   Mirian MoMatthew Gentry, MD  fluticasone [facility](FLONASE) 50 MCG/ACT nasal spray Place 2 sprays into the nose daily. 10/08/11 10/07/12  Domenick GongAshley Mortenson, MD  ibuprofen (ADVIL,MOTRIN) 800 MG tablet Take 1 tablet (800 mg total) by mouth 3 (three) times daily. Patient not taking: Reported on 05/03/2015 02/20/14   Tatyana Kirichenko, PA-C  metroNIDAZOLE (FLAGYL) 500 MG tablet Take 1 tablet (500 mg total) by mouth 2 (two) times daily. One po bid x 7 days Patient not taking: Reported on 05/03/2015 07/12/14   Mirian MoMatthew Gentry, MD  Pseudoephedrine-Guaifenesin [facility](MUCINEX D) [phone] MG TB12 Take 1 tablet by mouth 2 (two) times daily. Patient not taking: Reported on 05/03/2015 10/08/11   Domenick GongAshley Mortenson, MD  trimethoprim-polymyxin b [facility](POLYTRIM) ophthalmic solution Place 2 drops into the right eye every 6 (six) hours. X 7 days Patient not taking: Reported on 05/03/2015 11/28/13   Mathis FareJennifer Lee H Presson, PA  valACYclovir (VALTREX) 1000 MG tablet Take 1 tablet (1,000 mg total) by mouth 3 (three) times daily. Patient not taking: Reported on 05/03/2015 07/25/14   Hayden Rasmussenavid Mabe, NP  BP 110/79 mmHg  Pulse 82  Temp(Src) 98.1 F (36.7 C) (Oral)  Resp 18  SpO2 99% Physical Exam  Constitutional: She is oriented to person, place, and time. She appears well-developed and well-nourished. No distress.  HENT:  Head: Normocephalic and atraumatic.  Mouth/Throat: Oropharynx is clear and moist.  Eyes: Conjunctivae are normal. Pupils are equal, round, and reactive to light.  Neck: Normal range of motion. Neck supple.  Cardiovascular: Normal rate,  regular rhythm and intact distal pulses.   Pulmonary/Chest: Effort normal and breath sounds normal. No respiratory distress. She has no wheezes. She has no rales.  Abdominal: Soft. Bowel sounds are increased. There is no tenderness. There is no rebound and no guarding.  Musculoskeletal: Normal range of motion.  Neurological: She is alert and oriented to person, place, and time.  Skin: Skin is warm and dry.  Psychiatric: She has a normal mood and affect.    ED Course  Procedures (including critical care time) Labs Review Labs Reviewed  URINALYSIS, ROUTINE W REFLEX MICROSCOPIC (NOT AT [facility]) - Abnormal; Notable for the following:    APPearance CLOUDY (*)    Hgb urine dipstick MODERATE (*)    Protein, ur 30 (*)    Leukocytes, UA MODERATE (*)    All other components within normal limits  URINE MICROSCOPIC-ADD ON - Abnormal; Notable for the following:    Squamous Epithelial / LPF 0-5 (*)    Bacteria, UA FEW (*)    All other components within normal limits  POC URINE PREG, ED    Imaging Review No results found. I have personally reviewed and evaluated these images and lab results as part of my medical decision-making.   EKG Interpretation None      MDM   Final diagnoses:  None    Cystitis: symptoms consistent with UTI, states she bleeds usually when symptoms go on.  Exam urine and symptoms are consistent with UTI and not with kidney stone.  Pain is B low back below pelvic bones.  Will treat.  Strict return precautions given.      Cy Blamer, MD 05/03/15 [phone]

## 2017-07-20 ENCOUNTER — Encounter: Payer: Self-pay | Admitting: Gynecology

## 2017-07-20 ENCOUNTER — Ambulatory Visit (INDEPENDENT_AMBULATORY_CARE_PROVIDER_SITE_OTHER): Payer: BLUE CROSS/BLUE SHIELD | Admitting: Gynecology

## 2017-07-20 VITALS — BP 118/78 | Ht 65.5 in | Wt 219.0 lb

## 2017-07-20 DIAGNOSIS — E7439 Other disorders of intestinal carbohydrate absorption: Secondary | ICD-10-CM | POA: Diagnosis not present

## 2017-07-20 DIAGNOSIS — Z01419 Encounter for gynecological examination (general) (routine) without abnormal findings: Secondary | ICD-10-CM

## 2017-07-20 DIAGNOSIS — N926 Irregular menstruation, unspecified: Secondary | ICD-10-CM

## 2017-07-20 DIAGNOSIS — Z13220 Encounter for screening for lipoid disorders: Secondary | ICD-10-CM | POA: Diagnosis not present

## 2017-07-20 NOTE — Addendum Note (Signed)
Addended by: Dayna BarkerGARDNER, KIMBERLY K on: 07/20/2017 01:54 PM   Modules accepted: Orders

## 2017-07-20 NOTE — Patient Instructions (Signed)
Follow-up for lab test results.  Follow-up in 1 year for annual exam 

## 2017-07-20 NOTE — Progress Notes (Signed)
Stacy Hudson [birth year] [id number]        22 y.o.  G0P0000 new patient for annual gynecologic exam.  Notes her menses are irregular where she will have several months of menses then skip a month then have 1 or 2 menses then skip another month.  No prolonged bleeding or going more than 2 months without menses.  On history of sounds as if she was diagnosed with PCOS in the past as she was on metformin and describes having an ultrasound where she was told she had lots of little cysts.  Reports having a Pap smear last year although we do not have copies of this.  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  Currently not using contraception and would accept pregnancy if it occurred but not actively trying at this point as she is still in school.  Past medical history,surgical history, problem list, medications, allergies, family history and social history were all reviewed and documented as reviewed in the EPIC chart.  ROS:  Performed with pertinent positives and negatives included in the history, assessment and plan.   Additional significant findings : None   Exam: Kennon Portela assistant Vitals:   07/20/17 1215  BP: 118/78  Weight: 219 lb (99.3 kg)  Height: 5' 5.5" (1.664 m)   Body mass index is 35.89 kg/m.  General appearance:  Normal affect, orientation and appearance. Skin: Grossly normal HEENT: Without gross lesions.  No cervical or supraclavicular adenopathy. Thyroid normal.  Lungs:  Clear without wheezing, rales or rhonchi Cardiac: RR, without RMG Abdominal:  Soft, nontender, without masses, guarding, rebound, organomegaly or hernia Breasts:  Examined lying and sitting without masses, retractions, discharge or axillary adenopathy. Pelvic:  Ext, BUS, Vagina: Normal  Cervix: Normal.  Pap smear done  Uterus: Anteverted, normal size, shape and contour, midline and mobile nontender   Adnexa: Without masses or tenderness    Anus and  perineum: Normal     Assessment/Plan:  22 y.o. G0P0000 female for annual gynecologic exam with mild irregular menses, no contraception.   1. Irregular menses where she will skip a month every several months.  No prolonged or atypical bleeding in between.  Appears as ovulatory irregularity by history.  She was put on metformin in the past and said that her periods regulated.  Currently not using contraception and would accept pregnancy if it occurs but she is finishing school and not actively pursuing pregnancy.  Recommended baseline lab work to include FSH TSH and prolactin.  Options for management to include low-dose oral contraceptives for contraception/menstrual regulation.  Ovulatory stimulation such as Clomid when patient ready to actively pursue pregnancy.  Alternative would be metformin as it sounds like she has responded to this in the past.  We also discussed weight loss noting her BMI of 35 and that losing even 10-20 pounds may return her to a regular ovulatory pattern.  I also recommended starting on a prenatal vitamin now preconceptionally in the event she achieves pregnancy.  She will monitor her cycles for now but follow-up if she has significant menstrual irregularity or prolonged bleeding. 2. History of genital HSV.  Has prescription for Valtrex 1 g 3 times daily.  I reviewed with her that this sounds a little excessive.  I reviewed the current recommendations for 500 mg twice daily times 3-5 days at the earliest onset of symptoms versus 500 mg daily suppression dose.  At this point she has not had an  outbreak in a while she is going to monitor and follow expectantly and not take her Valtrex.  She will call me if she has any outbreaks. 3. Breast health.  Breast exam normal today. 4. Pap smear reported last year although do not have copies of this.  I went ahead and repeated her Pap smear now in the event that she did not have it done before. 5. STD screening discussed offered and  declined. 6. Gardasil series discussed.  The issues of her pursuing pregnancy within the 4066-month window was reviewed and at this point she is not interested in pursuing the series. 7. Health maintenance.  Baseline CBC, comprehensive metabolic panel, hemoglobin A1c given her history of glucose intolerance/metformin use and lipid profile ordered.  Patient will follow-up in 1 year, sooner if significant menstrual irregularity or other issues.   Dara Lordsimothy P Royale Lennartz MD, 1:00 PM 07/20/2017

## 2017-07-21 LAB — CBC WITH DIFFERENTIAL/PLATELET
BASOS ABS: 59 {cells}/uL (ref 0–200)
Basophils Relative: 0.9 %
EOS ABS: 119 {cells}/uL (ref 15–500)
Eosinophils Relative: 1.8 %
HEMATOCRIT: 41.7 % (ref 35.0–45.0)
HEMOGLOBIN: 14 g/dL (ref 11.7–15.5)
LYMPHS ABS: 2290 {cells}/uL (ref 850–3900)
MCH: 28.3 pg (ref 27.0–33.0)
MCHC: 33.6 g/dL (ref 32.0–36.0)
MCV: 84.4 fL (ref 80.0–100.0)
MPV: 10.4 fL (ref 7.5–12.5)
Monocytes Relative: 8.3 %
NEUTROS ABS: 3584 {cells}/uL (ref 1500–7800)
NEUTROS PCT: 54.3 %
Platelets: 274 10*3/uL (ref 140–400)
RBC: 4.94 10*6/uL (ref 3.80–5.10)
RDW: 13.2 % (ref 11.0–15.0)
Total Lymphocyte: 34.7 %
WBC: 6.6 10*3/uL (ref 3.8–10.8)
WBCMIX: 548 {cells}/uL (ref 200–950)

## 2017-07-21 LAB — COMPREHENSIVE METABOLIC PANEL
AG Ratio: 1.5 (calc) (ref 1.0–2.5)
ALBUMIN MSPROF: 4.2 g/dL (ref 3.6–5.1)
ALKALINE PHOSPHATASE (APISO): 64 U/L (ref 33–115)
ALT: 15 U/L (ref 6–29)
AST: 14 U/L (ref 10–30)
BUN: 11 mg/dL (ref 7–25)
CHLORIDE: 104 mmol/L (ref 98–110)
CO2: 29 mmol/L (ref 20–32)
CREATININE: 0.79 mg/dL (ref 0.50–1.10)
Calcium: 9.2 mg/dL (ref 8.6–10.2)
GLOBULIN: 2.8 g/dL (ref 1.9–3.7)
Glucose, Bld: 75 mg/dL (ref 65–99)
POTASSIUM: 4.4 mmol/L (ref 3.5–5.3)
SODIUM: 138 mmol/L (ref 135–146)
TOTAL PROTEIN: 7 g/dL (ref 6.1–8.1)
Total Bilirubin: 0.4 mg/dL (ref 0.2–1.2)

## 2017-07-21 LAB — HEMOGLOBIN A1C
Hgb A1c MFr Bld: 5.1 % of total Hgb (ref <5.7)
Mean Plasma Glucose: 100 (calc)
eAG (mmol/L): 5.5 (calc)

## 2017-07-21 LAB — LIPID PANEL
CHOL/HDL RATIO: 4.6 (calc) (ref <5.0)
Cholesterol: 166 mg/dL (ref <200)
HDL: 36 mg/dL — ABNORMAL LOW (ref >50)
LDL CHOLESTEROL (CALC): 114 mg/dL — AB
NON-HDL CHOLESTEROL (CALC): 130 mg/dL — AB (ref <130)
Triglycerides: 72 mg/dL (ref <150)

## 2017-07-21 LAB — FOLLICLE STIMULATING HORMONE: FSH: 7 m[IU]/mL

## 2017-07-21 LAB — TSH: TSH: 1.57 m[IU]/L

## 2017-07-21 LAB — PROLACTIN: PROLACTIN: 9.9 ng/mL

## 2017-07-24 LAB — PAP IG W/ RFLX HPV ASCU

## 2017-08-10 ENCOUNTER — Encounter (HOSPITAL_COMMUNITY): Payer: Self-pay | Admitting: *Deleted

## 2017-08-10 ENCOUNTER — Ambulatory Visit (HOSPITAL_COMMUNITY)
Admission: EM | Admit: 2017-08-10 | Discharge: 2017-08-10 | Disposition: A | Discharge status: Home / Self Care | Payer: BLUE CROSS/BLUE SHIELD | Attending: Family Medicine | Admitting: Family Medicine

## 2017-08-10 DIAGNOSIS — R110 Nausea: Secondary | ICD-10-CM | POA: Diagnosis not present

## 2017-08-10 DIAGNOSIS — H8110 Benign paroxysmal vertigo, unspecified ear: Secondary | ICD-10-CM

## 2017-08-10 DIAGNOSIS — R42 Dizziness and giddiness: Secondary | ICD-10-CM | POA: Diagnosis not present

## 2017-08-10 LAB — GLUCOSE, CAPILLARY: Glucose-Capillary: 78 mg/dL (ref 65–99)

## 2017-08-10 MED ORDER — MECLIZINE HCL 25 MG PO TABS: 25.0000 mg | ORAL_TABLET | Freq: Three times a day (TID) | ORAL | 0 refills | Status: DC | PRN

## 2017-08-10 MED ORDER — ONDANSETRON 4 MG PO TBDP
4.0000 mg | ORAL_TABLET | Freq: Once | ORAL | Status: AC
  Administered 2017-08-10: 4 mg via ORAL

## 2017-08-10 MED ORDER — ONDANSETRON HCL 4 MG PO TABS: 4.0000 mg | ORAL_TABLET | Freq: Four times a day (QID) | ORAL | 0 refills | Status: DC | PRN

## 2017-08-10 MED ORDER — ONDANSETRON 4 MG PO TBDP
ORAL_TABLET | ORAL | Status: AC
  Filled 2017-08-10: qty 1

## 2017-08-10 NOTE — ED Provider Notes (Signed)
[facility]    CSN: [id number] Arrival date & time: 08/10/17  1222     History   Chief Complaint Chief Complaint  Patient presents with  . Dizziness  . Nausea    HPI Stacy Hudson is a 22 y.o. female.   Patient complains of dizziness that began this morning (08/10/17).  She denies a precipitating event, or trauma.  She describes the dizziness as constant and like "being on a boat." She has tried draminie with minimal symptomatic relief.  Her symptoms are made worse with sudden movements.   She admits to previous symptoms in the past that resolved on its own.  She complains of associated nausea.    Patient states mother has type 2 diabetes and requests finger stick glucose     Past Medical History:  Diagnosis Date  . Lactose intolerance   . Sinusitis   . STD (sexually transmitted disease)    HSV,Chlamydia    There are no active problems to display for this patient.   Past Surgical History:  Procedure Laterality Date  . TOOTH EXTRACTION      OB History    Gravida  0   Para  0   Term  0   Preterm  0   AB  0   Living  0     SAB  0   TAB  0   Ectopic  0   Multiple  0   Live Births  0            Home Medications    Prior to Admission medications   Medication Sig Start Date End Date Taking? Authorizing Provider  acetaminophen (TYLENOL) 500 MG tablet Take 500 mg by mouth every 6 (six) hours as needed for mild pain.     [provider]  acyclovir ointment (ZOVIRAX) 5 % Apply 1 application topically every 3 (three) hours. Patient not taking: Reported on 05/03/2015 07/25/14   Hayden Rasmussen, NP  ibuprofen (ADVIL,MOTRIN) 800 MG tablet Take 1 tablet (800 mg total) by mouth 3 (three) times daily. 02/20/14   Kirichenko, [place] Fillers, PA-C  meclizine (ANTIVERT) 25 MG tablet Take 1 tablet (25 mg total) by mouth 3 (three) times daily as needed for dizziness. 08/10/17   Demerius Podolak, [place], PA-C  ondansetron (ZOFRAN) 4 MG tablet Take 1 tablet  (4 mg total) by mouth 4 (four) times daily as needed for nausea or vomiting. 08/10/17   Ayannah Faddis, [place], PA-C  Pseudoephedrine-Guaifenesin (MUCINEX D) [phone] MG TB12 Take 1 tablet by mouth 2 (two) times daily. 10/08/11   Domenick Gong, MD  valACYclovir (VALTREX) 1000 MG tablet Take 1 tablet (1,000 mg total) by mouth 3 (three) times daily. 07/25/14   Hayden Rasmussen, NP    Family History Family History  Problem Relation Age of Onset  . Hypertension Mother   . Diabetes Mother   . Hypertension Father   . Diabetes Father   . Hypertension Maternal Grandmother   . Hyperlipidemia Maternal Grandmother   . Diabetes Paternal Grandmother     Social History Social History   Tobacco Use  . Smoking status: Former Smoker    Types: Cigarettes    Last attempt to quit: 03/02/2015    Years since quitting: 2.4  . Smokeless tobacco: Never Used  Substance Use Topics  . Alcohol use: Yes    Comment: Occas  . Drug use: No     Allergies   Penicillins and Rocephin [ceftriaxone sodium in dextrose]   Review of Systems Review  of Systems  Constitutional: Negative for chills, fatigue and fever.  HENT: Negative for congestion, ear discharge, ear pain, sinus pressure and sinus pain.   Respiratory: Negative for cough and shortness of breath.   Cardiovascular: Negative for chest pain.  Gastrointestinal: Positive for nausea. Negative for abdominal pain and vomiting.  Neurological: Positive for dizziness.     Physical Exam Triage Vital Signs ED Triage Vitals [08/10/17 1237]  Enc Vitals Group     BP 105/62     Pulse Rate 63     Resp 17     Temp 98.2 F (36.8 C)     Temp Source Oral     SpO2 100 %     Weight      Height      Head Circumference      Peak Flow      Pain Score      Pain Loc      Pain Edu?      Excl. in GC?    No data found.  Updated Vital Signs BP 105/62 (BP Location: Right Arm)   Pulse 63   Temp 98.2 F (36.8 C) (Oral)   Resp 17   SpO2 100%   Visual Acuity Right  Eye Distance:   Left Eye Distance:   Bilateral Distance:    Right Eye Near:   Left Eye Near:    Bilateral Near:     Physical Exam  Constitutional: She is oriented to person, place, and time. She appears well-developed and well-nourished. No distress.  HENT:  Head: Normocephalic and atraumatic.  Right Ear: External ear normal.  Left Ear: External ear normal.  Nose: Nose normal.  Mouth/Throat: Oropharynx is clear and moist. No oropharyngeal exudate.  Eyes: Pupils are equal, round, and reactive to light. Conjunctivae and EOM are normal. No scleral icterus.  Neck: Normal range of motion. Neck supple.  Cardiovascular: Normal rate, regular rhythm and normal heart sounds. Exam reveals no friction rub.  No murmur heard. Radial pulse 2+ bilaterally    Pulmonary/Chest: Effort normal and breath sounds normal. No stridor. No respiratory distress. She has no wheezes. She has no rales.  Abdominal: Soft. Bowel sounds are normal. There is no tenderness.  Examined patient while sitting.  Patient concerned for vomiting while supine  Lymphadenopathy:    She has no cervical adenopathy.  Neurological: She is alert and oriented to person, place, and time.  Skin: Skin is warm and dry. Capillary refill takes less than 2 seconds. She is not diaphoretic.  Psychiatric: She has a normal mood and affect. Her behavior is normal. Judgment and thought content normal.     UC Treatments / Results  Labs (all labs ordered are listed, but only abnormal results are displayed) Labs Reviewed  GLUCOSE, CAPILLARY    EKG None Radiology No results found.  Procedures Procedures (including critical care time)  Medications Ordered in UC Medications  ondansetron (ZOFRAN-ODT) disintegrating tablet 4 mg (4 mg Oral Given 08/10/17 1317)     Initial Impression / Assessment and Plan / UC Course  I have reviewed the triage vital signs and the nursing notes.  Pertinent labs & imaging results that were available  during my care of the patient were reviewed by me and considered in my medical decision making (see chart for details).    Patient's hx, symptoms and PE consistent with vertigo.  Patient concerned for low blood glucose and requests fingerstick. Blood glucose 78 WNL. Given zofran in office for nausea.  Prescribed meclizine  and zofran for symptomatic relief.  Referral to PCP to establish care and follow up if symptoms persists.   Return and ER precautions given.    Final Clinical Impressions(s) / UC Diagnoses   Final diagnoses:  Benign paroxysmal positional vertigo, unspecified laterality  Dizziness    ED Discharge Orders        Ordered    meclizine (ANTIVERT) 25 MG tablet  3 times daily PRN     08/10/17 1308    ondansetron (ZOFRAN) 4 MG tablet  4 times daily PRN     08/10/17 1308       Controlled Substance Prescriptions [place] Controlled Substance Registry consulted? Not Applicable   Rennis Harding, [place] 08/10/17 1321

## 2017-08-10 NOTE — ED Triage Notes (Signed)
Patient states she woke up with dizziness, sudden movements make her nauseas. 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

## 2017-08-10 NOTE — Discharge Instructions (Addendum)
Get rest and drink fluid Given zofran in office Prescribed meclizine take as needed for dizziness Prescribed zofran take as needed for nausea and vomiting Referral to PCP to establish care and if symptoms persist Return or go to ER if you have any new or worsening symptoms

## 2018-04-24 ENCOUNTER — Ambulatory Visit (INDEPENDENT_AMBULATORY_CARE_PROVIDER_SITE_OTHER): Payer: Self-pay | Admitting: *Deleted

## 2018-04-24 DIAGNOSIS — Z3200 Encounter for pregnancy test, result unknown: Secondary | ICD-10-CM

## 2018-04-24 LAB — POCT PREGNANCY, URINE: PREG TEST UR: NEGATIVE

## 2018-04-24 NOTE — Progress Notes (Signed)
Pt informed of negative UPT today. She states she has taken 5 UPT's at home since yesterday morning. Pt showed me pictures on her phone of the test results - some were positive and some equivocal, one negative. She reports LMP 03/18/18. She started having some diarrhea and slight nausea today. I advised pt that in early pregnancy it is possible to obtain false negative results especially if the test is not done on first morning urine specimen. Pt was advised that she may be pregnant. If she desires to check again @ home she should wait 2 days, then check only the first morning urine. If positive, she should return here next week for UPT. Pt voiced understanding of all information and instructions given.

## 2018-04-24 NOTE — Progress Notes (Signed)
I have reviewed the chart and agree with nursing staff's documentation of this patient's encounter.  Vonzella Nipple, PA-C 04/24/2018 12:06 PM

## 2018-05-14 LAB — OB RESULTS CONSOLE RPR: RPR: NONREACTIVE

## 2018-05-14 LAB — OB RESULTS CONSOLE HIV ANTIBODY (ROUTINE TESTING): HIV: NONREACTIVE

## 2018-05-14 LAB — OB RESULTS CONSOLE ANTIBODY SCREEN: Antibody Screen: NEGATIVE

## 2018-05-14 LAB — OB RESULTS CONSOLE GC/CHLAMYDIA
Chlamydia: NEGATIVE
Gonorrhea: NEGATIVE

## 2018-05-14 LAB — OB RESULTS CONSOLE ABO/RH: RH Type: POSITIVE

## 2018-05-14 LAB — OB RESULTS CONSOLE RUBELLA ANTIBODY, IGM: Rubella: IMMUNE

## 2018-05-14 LAB — OB RESULTS CONSOLE HEPATITIS B SURFACE ANTIGEN: Hepatitis B Surface Ag: NEGATIVE

## 2018-12-10 LAB — OB RESULTS CONSOLE GBS: GBS: NEGATIVE

## 2019-01-03 ENCOUNTER — Other Ambulatory Visit: Payer: Self-pay | Admitting: Obstetrics and Gynecology

## 2019-01-03 ENCOUNTER — Telehealth (HOSPITAL_COMMUNITY): Payer: Self-pay | Admitting: *Deleted

## 2019-01-03 ENCOUNTER — Encounter (HOSPITAL_COMMUNITY): Payer: Self-pay | Admitting: *Deleted

## 2019-01-03 NOTE — Telephone Encounter (Signed)
Preadmission screen  

## 2019-01-04 ENCOUNTER — Other Ambulatory Visit (HOSPITAL_COMMUNITY)
Admission: RE | Admit: 2019-01-04 | Discharge: 2019-01-04 | Disposition: A | Discharge status: Home / Self Care | Payer: Medicaid Other | Source: Ambulatory Visit | Attending: Obstetrics and Gynecology | Admitting: Obstetrics and Gynecology

## 2019-01-04 ENCOUNTER — Other Ambulatory Visit: Payer: Self-pay

## 2019-01-04 DIAGNOSIS — Z01812 Encounter for preprocedural laboratory examination: Secondary | ICD-10-CM | POA: Diagnosis not present

## 2019-01-04 DIAGNOSIS — Z20828 Contact with and (suspected) exposure to other viral communicable diseases: Secondary | ICD-10-CM | POA: Insufficient documentation

## 2019-01-04 LAB — SARS CORONAVIRUS 2 (TAT 6-24 HRS): SARS Coronavirus 2: NEGATIVE

## 2019-01-04 NOTE — MAU Note (Signed)
Pt here for PAT covid swab, denies symptoms. Swab collected. 

## 2019-01-06 ENCOUNTER — Other Ambulatory Visit: Payer: Self-pay

## 2019-01-06 ENCOUNTER — Inpatient Hospital Stay (HOSPITAL_COMMUNITY)
Admission: RE | Admit: 2019-01-06 | Discharge: 2019-01-10 | DRG: 786 | Disposition: A | Discharge status: Home / Self Care | Payer: Medicaid Other | Attending: Obstetrics & Gynecology | Admitting: Obstetrics & Gynecology

## 2019-01-06 ENCOUNTER — Encounter: Payer: Self-pay | Admitting: Gynecology

## 2019-01-06 ENCOUNTER — Inpatient Hospital Stay (HOSPITAL_COMMUNITY): Payer: Medicaid Other

## 2019-01-06 ENCOUNTER — Encounter (HOSPITAL_COMMUNITY): Payer: Self-pay

## 2019-01-06 DIAGNOSIS — A6000 Herpesviral infection of urogenital system, unspecified: Secondary | ICD-10-CM | POA: Diagnosis present

## 2019-01-06 DIAGNOSIS — O411230 Chorioamnionitis, third trimester, not applicable or unspecified: Secondary | ICD-10-CM | POA: Diagnosis present

## 2019-01-06 DIAGNOSIS — O9832 Other infections with a predominantly sexual mode of transmission complicating childbirth: Secondary | ICD-10-CM | POA: Diagnosis present

## 2019-01-06 DIAGNOSIS — Z3A40 40 weeks gestation of pregnancy: Secondary | ICD-10-CM | POA: Diagnosis not present

## 2019-01-06 DIAGNOSIS — Z87891 Personal history of nicotine dependence: Secondary | ICD-10-CM

## 2019-01-06 DIAGNOSIS — O99214 Obesity complicating childbirth: Principal | ICD-10-CM | POA: Diagnosis present

## 2019-01-06 DIAGNOSIS — D72829 Elevated white blood cell count, unspecified: Secondary | ICD-10-CM | POA: Diagnosis not present

## 2019-01-06 DIAGNOSIS — O99213 Obesity complicating pregnancy, third trimester: Secondary | ICD-10-CM | POA: Diagnosis present

## 2019-01-06 HISTORY — DX: Anxiety disorder, unspecified: F41.9

## 2019-01-06 LAB — RPR: RPR Ser Ql: NONREACTIVE

## 2019-01-06 LAB — CBC
HCT: 41.3 % (ref 36.0–46.0)
Hemoglobin: 14.2 g/dL (ref 12.0–15.0)
MCH: 28.9 pg (ref 26.0–34.0)
MCHC: 34.4 g/dL (ref 30.0–36.0)
MCV: 83.9 fL (ref 80.0–100.0)
Platelets: 220 10*3/uL (ref 150–400)
RBC: 4.92 MIL/uL (ref 3.87–5.11)
RDW: 16.3 % — ABNORMAL HIGH (ref 11.5–15.5)
WBC: 10.6 10*3/uL — ABNORMAL HIGH (ref 4.0–10.5)
nRBC: 0 % (ref 0.0–0.2)

## 2019-01-06 LAB — ABO/RH: ABO/RH(D): O POS

## 2019-01-06 MED ORDER — OXYTOCIN 40 UNITS IN NORMAL SALINE INFUSION - SIMPLE MED: 1.0000 m[IU]/min | INTRAVENOUS | Status: DC

## 2019-01-06 MED ORDER — MISOPROSTOL 25 MCG QUARTER TABLET
25.0000 ug | ORAL_TABLET | ORAL | Status: DC | PRN
  Administered 2019-01-06 (×2): 25 ug via VAGINAL
  Filled 2019-01-06 (×2): qty 1

## 2019-01-06 MED ORDER — FENTANYL CITRATE (PF) 100 MCG/2ML IJ SOLN
100.0000 ug | INTRAMUSCULAR | Status: DC | PRN
  Administered 2019-01-06 – 2019-01-07 (×3): 100 ug via INTRAVENOUS
  Filled 2019-01-06 (×2): qty 2

## 2019-01-06 MED ORDER — OXYTOCIN 40 UNITS IN NORMAL SALINE INFUSION - SIMPLE MED: 2.5000 [IU]/h | INTRAVENOUS | Status: DC

## 2019-01-06 MED ORDER — FLEET ENEMA 7-19 GM/118ML RE ENEM: 1.0000 | ENEMA | RECTAL | Status: DC | PRN

## 2019-01-06 MED ORDER — TERBUTALINE SULFATE 1 MG/ML IJ SOLN: 0.2500 mg | Freq: Once | INTRAMUSCULAR | Status: DC | PRN

## 2019-01-06 MED ORDER — ONDANSETRON HCL 4 MG/2ML IJ SOLN
4.0000 mg | Freq: Four times a day (QID) | INTRAMUSCULAR | Status: DC | PRN
  Administered 2019-01-07 (×2): 4 mg via INTRAVENOUS
  Filled 2019-01-06 (×2): qty 2

## 2019-01-06 MED ORDER — OXYTOCIN 40 UNITS IN NORMAL SALINE INFUSION - SIMPLE MED
1.0000 m[IU]/min | INTRAVENOUS | Status: DC
  Administered 2019-01-06: 2 m[IU]/min via INTRAVENOUS
  Administered 2019-01-06: 4 m[IU]/min via INTRAVENOUS
  Filled 2019-01-06: qty 1000

## 2019-01-06 MED ORDER — OXYTOCIN BOLUS FROM INFUSION: 500.0000 mL | Freq: Once | INTRAVENOUS | Status: DC

## 2019-01-06 MED ORDER — ACETAMINOPHEN 325 MG PO TABS: 650.0000 mg | ORAL_TABLET | ORAL | Status: DC | PRN

## 2019-01-06 MED ORDER — LIDOCAINE HCL (PF) 1 % IJ SOLN: 30.0000 mL | INTRAMUSCULAR | Status: DC | PRN

## 2019-01-06 MED ORDER — FENTANYL CITRATE (PF) 100 MCG/2ML IJ SOLN
50.0000 ug | INTRAMUSCULAR | Status: DC | PRN
  Filled 2019-01-06: qty 2

## 2019-01-06 MED ORDER — LACTATED RINGERS IV SOLN
500.0000 mL | INTRAVENOUS | Status: DC | PRN
  Administered 2019-01-07 (×3): 500 mL via INTRAVENOUS

## 2019-01-06 MED ORDER — LACTATED RINGERS IV SOLN
INTRAVENOUS | Status: DC
  Administered 2019-01-06 – 2019-01-07 (×7): via INTRAVENOUS
  Administered 2019-01-07: 08:00:00 125 mL/h via INTRAVENOUS

## 2019-01-06 MED ORDER — SOD CITRATE-CITRIC ACID 500-334 MG/5ML PO SOLN
30.0000 mL | ORAL | Status: DC | PRN
  Administered 2019-01-07: 30 mL via ORAL
  Filled 2019-01-06: qty 30

## 2019-01-06 NOTE — Progress Notes (Addendum)
Stacy Hudson is a 23 y.o. G1P0000 at [redacted]w[redacted]d admitted for IOL for BMI of 41  Subjective: Doing well, reports effective pain mngt w/ Fentanyl. Declines epidural @ this time.   Objective: BP (!) 98/50 (BP Location: Right Arm)   Pulse 69   Temp 98.1 F (36.7 C) (Oral)   Resp 18   Ht 5' 5.5" (1.664 m)   Wt 115.7 kg   LMP 03/18/2018   BMI 41.81 kg/m   FHT:  FHR: 135 bpm, variability: moderate,  accelerations:  Present,  decelerations:  Absent UC:   irregular SVE:   Dilation: 2 Effacement (%): 50 Station: -2 Exam by:: dillard  SROM 01/06/19 @ 1410, light mec Cervical balloon still in place. Traction applied by RN, no expulsion.   Labs: Lab Results  Component Value Date   WBC 10.6 (H) 01/06/2019   HGB 14.2 01/06/2019   HCT 41.3 01/06/2019   MCV 83.9 01/06/2019   PLT 220 01/06/2019    Assessment / Plan: G1 P0 @ 40 wks IOL for BMI  Labor: IOL- S/P Cytotec x2, Pitocin stiopped D/T tachysystole, Foley balloon currently in place Remove foley balloon @ 0200 and start Pitocin Fetal Wellbeing:  Category I Pain Control:  IV pain meds I/D:  GBS neg Anticipated MOD:  NSVD  Arrie Eastern MSN, CNM 01/06/2019, 8:08 PM

## 2019-01-06 NOTE — Progress Notes (Signed)
Tracing reviewed remotely Cat 1/Reactive baseline 135 Foley in place, initially place by Dr. Charlesetta Garibaldi at 2pm

## 2019-01-06 NOTE — H&P (Signed)
Stacy Hudson is a 23 y.o. female presenting for IOL. Pt was given cytotec and then placed on pitocin.  Pitocin stopped bc of tachysystole.   OB History    Gravida  1   Para  0   Term  0   Preterm  0   AB  0   Living  0     SAB  0   TAB  0   Ectopic  0   Multiple  0   Live Births  0          Past Medical History:  Diagnosis Date  . Anxiety   . BPPV (benign paroxysmal positional vertigo)   . Depression   . History of chlamydia   . HSV (herpes simplex virus) anogenital infection   . Lactose intolerance   . Sinusitis   . STD (sexually transmitted disease)    HSV,Chlamydia   Past Surgical History:  Procedure Laterality Date  . TOOTH EXTRACTION     Family History: family history includes Breast cancer in her maternal grandmother; Cervical cancer in her maternal grandmother; Diabetes in her father, mother, and paternal grandmother; Heart disease in her mother; Hyperlipidemia in her maternal grandmother; Hypertension in her father, maternal grandmother, and mother; Kidney disease in her mother. Social History:  reports that she quit smoking about 3 years ago. Her smoking use included cigarettes. She has never used smokeless tobacco. She reports previous alcohol use. She reports that she does not use drugs.     Maternal Diabetes: No Genetic Screening: Normal Maternal Ultrasounds/Referrals: Normal Fetal Ultrasounds or other Referrals:  Fetal echo Maternal Substance Abuse:  No Significant Maternal Medications:  None Significant Maternal Lab Results:  Group B Strep negative Other Comments:  fetal echo WNL  ROS History  Physical Examination: General appearance - alert, well appearing, and in no distress Chest - clear to auscultation, no wheezes, rales or rhonchi, symmetric air entry Heart - normal rate and regular rhythm Abdomen - soft, nontender, nondistended, no masses or organomegaly gravid Extremities - peripheral pulses normal, no pedal edema, no  clubbing or cyanosis, Homan's sign negative bilaterally Dilation: 2 Effacement (%): 50 Station: -2 Exam by:: Stacy Hudson Blood pressure 116/63, pulse 73, temperature 98 F (36.7 C), temperature source Oral, resp. rate 20, height 5' 5.5" (1.664 m), weight 115.7 kg, last menstrual period 03/18/2018. Exam Physical Exam  Prenatal labs: ABO, Rh: --/--/O POS, O POS Performed at [facility], [place] [address] [place], [place] [address]  [phone]/21 0046) Antibody: NEG (09/21 0046) Rubella: Immune (01/28 0000) RPR: NON REACTIVE (09/21 0046)  HBsAg: Negative (01/28 0000)  HIV: Non-reactive (01/28 0000)  GBS: Negative/-- (08/25 0000)   Assessment/Plan: IOL for obesity GBS neg HSV 2 spec no lesions.  Pt declines any prodrome or symptoms Pt has SROM after spec with meconium noted.  Foley catheter placed in the cervix Pt s/p two cytotec and pitocin.  Pitocin stopped bc of tachysytole Cat 1 Anticipate SVD   Stacy Hudson A Stacy Hudson 01/06/2019, 2:49 PM

## 2019-01-07 ENCOUNTER — Encounter (HOSPITAL_COMMUNITY)
Admission: RE | Disposition: A | Discharge status: Home / Self Care | Payer: Self-pay | Source: Home / Self Care | Attending: Obstetrics & Gynecology

## 2019-01-07 ENCOUNTER — Inpatient Hospital Stay (HOSPITAL_COMMUNITY): Payer: Medicaid Other | Admitting: Anesthesiology

## 2019-01-07 ENCOUNTER — Encounter (HOSPITAL_COMMUNITY): Payer: Self-pay | Admitting: Anesthesiology

## 2019-01-07 LAB — PREPARE RBC (CROSSMATCH)

## 2019-01-07 SURGERY — Surgical Case
Anesthesia: Epidural

## 2019-01-07 MED ORDER — EPHEDRINE 5 MG/ML INJ: 10.0000 mg | INTRAVENOUS | Status: DC | PRN

## 2019-01-07 MED ORDER — SODIUM CHLORIDE 0.9 % IV SOLN
INTRAVENOUS | Status: DC | PRN
  Administered 2019-01-07: 22:00:00 via INTRAVENOUS

## 2019-01-07 MED ORDER — OXYCODONE HCL 5 MG/5ML PO SOLN: 5.0000 mg | Freq: Once | ORAL | Status: DC | PRN

## 2019-01-07 MED ORDER — FENTANYL-BUPIVACAINE-NACL 0.5-0.125-0.9 MG/250ML-% EP SOLN
12.0000 mL/h | EPIDURAL | Status: DC | PRN
  Filled 2019-01-07: qty 250

## 2019-01-07 MED ORDER — SOD CITRATE-CITRIC ACID 500-334 MG/5ML PO SOLN: 30.0000 mL | ORAL | Status: DC

## 2019-01-07 MED ORDER — SODIUM BICARBONATE 8.4 % IV SOLN
INTRAVENOUS | Status: AC
  Filled 2019-01-07: qty 50

## 2019-01-07 MED ORDER — KETOROLAC TROMETHAMINE 30 MG/ML IJ SOLN
INTRAMUSCULAR | Status: AC
  Filled 2019-01-07: qty 1

## 2019-01-07 MED ORDER — LIDOCAINE-EPINEPHRINE (PF) 2 %-1:200000 IJ SOLN
INTRAMUSCULAR | Status: AC
  Filled 2019-01-07: qty 10

## 2019-01-07 MED ORDER — PHENYLEPHRINE 40 MCG/ML (10ML) SYRINGE FOR IV PUSH (FOR BLOOD PRESSURE SUPPORT)
PREFILLED_SYRINGE | INTRAVENOUS | Status: DC | PRN
  Administered 2019-01-07: 40 ug via INTRAVENOUS

## 2019-01-07 MED ORDER — CLINDAMYCIN PHOSPHATE 900 MG/50ML IV SOLN
900.0000 mg | Freq: Three times a day (TID) | INTRAVENOUS | Status: DC
  Administered 2019-01-07: 20:00:00 900 mg via INTRAVENOUS
  Filled 2019-01-07: qty 50

## 2019-01-07 MED ORDER — SODIUM CHLORIDE 0.9 % IV SOLN
INTRAVENOUS | Status: DC | PRN
  Administered 2019-01-07: 40 [IU] via INTRAVENOUS

## 2019-01-07 MED ORDER — PHENYLEPHRINE 40 MCG/ML (10ML) SYRINGE FOR IV PUSH (FOR BLOOD PRESSURE SUPPORT)
80.0000 ug | PREFILLED_SYRINGE | INTRAVENOUS | Status: DC | PRN
  Filled 2019-01-07: qty 10

## 2019-01-07 MED ORDER — OXYTOCIN 40 UNITS IN NORMAL SALINE INFUSION - SIMPLE MED
INTRAVENOUS | Status: AC
  Filled 2019-01-07: qty 1000

## 2019-01-07 MED ORDER — FENTANYL CITRATE (PF) 100 MCG/2ML IJ SOLN: 25.0000 ug | INTRAMUSCULAR | Status: DC | PRN

## 2019-01-07 MED ORDER — GENTAMICIN SULFATE 40 MG/ML IJ SOLN
5.0000 mg/kg | INTRAVENOUS | Status: DC
  Administered 2019-01-07: 580 mg via INTRAVENOUS
  Filled 2019-01-07: qty 14.5

## 2019-01-07 MED ORDER — ONDANSETRON HCL 4 MG/2ML IJ SOLN
INTRAMUSCULAR | Status: AC
  Filled 2019-01-07: qty 2

## 2019-01-07 MED ORDER — MORPHINE SULFATE (PF) 0.5 MG/ML IJ SOLN
INTRAMUSCULAR | Status: AC
  Filled 2019-01-07: qty 10

## 2019-01-07 MED ORDER — MORPHINE SULFATE (PF) 0.5 MG/ML IJ SOLN
INTRAMUSCULAR | Status: DC | PRN
  Administered 2019-01-07: 3 mg via EPIDURAL

## 2019-01-07 MED ORDER — METOCLOPRAMIDE HCL 5 MG/ML IJ SOLN
INTRAMUSCULAR | Status: AC
  Filled 2019-01-07: qty 2

## 2019-01-07 MED ORDER — ACETAMINOPHEN 500 MG PO TABS: 1000.0000 mg | ORAL_TABLET | Freq: Four times a day (QID) | ORAL | Status: DC | PRN

## 2019-01-07 MED ORDER — OXYCODONE HCL 5 MG PO TABS: 5.0000 mg | ORAL_TABLET | Freq: Once | ORAL | Status: DC | PRN

## 2019-01-07 MED ORDER — SODIUM CHLORIDE 0.9 % IR SOLN
Status: DC | PRN
  Administered 2019-01-07 (×2): 1

## 2019-01-07 MED ORDER — PHENYLEPHRINE 40 MCG/ML (10ML) SYRINGE FOR IV PUSH (FOR BLOOD PRESSURE SUPPORT)
PREFILLED_SYRINGE | INTRAVENOUS | Status: AC
  Filled 2019-01-07: qty 10

## 2019-01-07 MED ORDER — LIDOCAINE-EPINEPHRINE (PF) 2 %-1:200000 IJ SOLN
INTRAMUSCULAR | Status: DC | PRN
  Administered 2019-01-07: 2 mL via EPIDURAL
  Administered 2019-01-07: 1 mL via EPIDURAL

## 2019-01-07 MED ORDER — PHENYLEPHRINE 40 MCG/ML (10ML) SYRINGE FOR IV PUSH (FOR BLOOD PRESSURE SUPPORT): 80.0000 ug | PREFILLED_SYRINGE | INTRAVENOUS | Status: DC | PRN

## 2019-01-07 MED ORDER — ONDANSETRON HCL 4 MG/2ML IJ SOLN
INTRAMUSCULAR | Status: DC | PRN
  Administered 2019-01-07: 4 mg via INTRAVENOUS

## 2019-01-07 MED ORDER — ONDANSETRON HCL 4 MG/2ML IJ SOLN: 4.0000 mg | Freq: Once | INTRAMUSCULAR | Status: DC | PRN

## 2019-01-07 MED ORDER — KETOROLAC TROMETHAMINE 30 MG/ML IJ SOLN
30.0000 mg | Freq: Once | INTRAMUSCULAR | Status: AC | PRN
  Administered 2019-01-07: 30 mg via INTRAVENOUS

## 2019-01-07 MED ORDER — LACTATED RINGERS IV SOLN: 500.0000 mL | Freq: Once | INTRAVENOUS | Status: DC

## 2019-01-07 MED ORDER — SODIUM CHLORIDE 0.9% IV SOLUTION: Freq: Once | INTRAVENOUS | Status: DC

## 2019-01-07 MED ORDER — DEXAMETHASONE SODIUM PHOSPHATE 10 MG/ML IJ SOLN
INTRAMUSCULAR | Status: AC
  Filled 2019-01-07: qty 1

## 2019-01-07 MED ORDER — DEXAMETHASONE SODIUM PHOSPHATE 10 MG/ML IJ SOLN
INTRAMUSCULAR | Status: DC | PRN
  Administered 2019-01-07: 10 mg via INTRAVENOUS

## 2019-01-07 MED ORDER — METOCLOPRAMIDE HCL 5 MG/ML IJ SOLN
INTRAMUSCULAR | Status: DC | PRN
  Administered 2019-01-07: 10 mg via INTRAVENOUS

## 2019-01-07 MED ORDER — DIPHENHYDRAMINE HCL 50 MG/ML IJ SOLN: 12.5000 mg | INTRAMUSCULAR | Status: DC | PRN

## 2019-01-07 MED ORDER — ACETAMINOPHEN 500 MG PO TABS
1000.0000 mg | ORAL_TABLET | Freq: Once | ORAL | Status: AC
  Administered 2019-01-07: 17:00:00 1000 mg via ORAL
  Filled 2019-01-07: qty 2

## 2019-01-07 MED ORDER — SODIUM CHLORIDE (PF) 0.9 % IJ SOLN
INTRAMUSCULAR | Status: DC | PRN
  Administered 2019-01-07: 12 mL/h via EPIDURAL

## 2019-01-07 MED ORDER — SODIUM BICARBONATE 8.4 % IV SOLN
INTRAVENOUS | Status: DC | PRN
  Administered 2019-01-07 (×3): 5 mL via EPIDURAL

## 2019-01-07 SURGICAL SUPPLY — 39 items
BENZOIN TINCTURE PRP APPL 2/3 (GAUZE/BANDAGES/DRESSINGS) ×2 IMPLANT
CHLORAPREP W/TINT 26ML (MISCELLANEOUS) ×2 IMPLANT
CLAMP CORD UMBIL (MISCELLANEOUS) IMPLANT
CLOTH BEACON ORANGE TIMEOUT ST (SAFETY) ×2 IMPLANT
DERMABOND ADVANCED (GAUZE/BANDAGES/DRESSINGS)
DERMABOND ADVANCED .7 DNX12 (GAUZE/BANDAGES/DRESSINGS) IMPLANT
DRAPE C SECTION CLR SCREEN (DRAPES) ×2 IMPLANT
DRSG OPSITE POSTOP 4X10 (GAUZE/BANDAGES/DRESSINGS) ×2 IMPLANT
ELECT REM PT RETURN 9FT ADLT (ELECTROSURGICAL) ×2
ELECTRODE REM PT RTRN 9FT ADLT (ELECTROSURGICAL) ×1 IMPLANT
EXTRACTOR VACUUM M CUP 4 TUBE (SUCTIONS) IMPLANT
GLOVE BIOGEL PI IND STRL 7.0 (GLOVE) ×2 IMPLANT
GLOVE BIOGEL PI INDICATOR 7.0 (GLOVE) ×2
GLOVE SURG SS PI 6.5 STRL IVOR (GLOVE) ×2 IMPLANT
GOWN STRL REUS W/TWL LRG LVL3 (GOWN DISPOSABLE) ×4 IMPLANT
KIT ABG SYR 3ML LUER SLIP (SYRINGE) IMPLANT
NEEDLE HYPO 25X5/8 SAFETYGLIDE (NEEDLE) IMPLANT
NS IRRIG 1000ML POUR BTL (IV SOLUTION) ×2 IMPLANT
PACK C SECTION WH (CUSTOM PROCEDURE TRAY) ×2 IMPLANT
PAD OB MATERNITY 4.3X12.25 (PERSONAL CARE ITEMS) ×2 IMPLANT
PENCIL SMOKE EVAC W/HOLSTER (ELECTROSURGICAL) ×2 IMPLANT
RTRCTR C-SECT PINK 25CM LRG (MISCELLANEOUS) ×2 IMPLANT
SPONGE LAP 18X18 X RAY DECT (DISPOSABLE) ×4 IMPLANT
STRIP CLOSURE SKIN 1/2X4 (GAUZE/BANDAGES/DRESSINGS) ×2 IMPLANT
SUT CHROMIC 1 CTX 36 (SUTURE) IMPLANT
SUT CHROMIC 2 0 CT 1 (SUTURE) ×2 IMPLANT
SUT MON AB 4-0 PS1 27 (SUTURE) ×2 IMPLANT
SUT PLAIN 1 NONE 54 (SUTURE) IMPLANT
SUT PLAIN 2 0 (SUTURE)
SUT PLAIN 2 0 XLH (SUTURE) ×4 IMPLANT
SUT PLAIN ABS 2-0 CT1 27XMFL (SUTURE) IMPLANT
SUT VIC AB 0 CTX 36 (SUTURE) ×1
SUT VIC AB 0 CTX36XBRD ANBCTRL (SUTURE) ×1 IMPLANT
SUT VIC AB 1 CTX 36 (SUTURE) ×2
SUT VIC AB 1 CTX36XBRD ANBCTRL (SUTURE) ×2 IMPLANT
SUT VIC AB 4-0 KS 27 (SUTURE) ×2 IMPLANT
TOWEL OR 17X24 6PK STRL BLUE (TOWEL DISPOSABLE) ×2 IMPLANT
TRAY FOLEY W/BAG SLVR 14FR LF (SET/KITS/TRAYS/PACK) ×2 IMPLANT
WATER STERILE IRR 1000ML POUR (IV SOLUTION) ×4 IMPLANT

## 2019-01-07 NOTE — H&P (View-Only) (Signed)
Stacy Hudson is a 23 y.o. G1P0000 at [redacted]w[redacted]d admitted for labor induction for high BMI  Subjective: Patient comfortable with epidural, c/o of mid upper back pain with positional changes.   Objective: BP (!) 111/55   Pulse (!) 128   Temp (!) 103.1 F (39.5 C) (Oral)   Resp 20   Ht 5' 5.5" (1.664 m)   Wt 115.7 kg   LMP 03/18/2018   SpO2 99%   BMI 41.81 kg/m  I/O last 3 completed shifts: In: -  Out: 975 [Urine:975] Total I/O In: -  Out: 100 [Urine:100]  FHT:  FHR: 180 bpm, variability: minimal ,  accelerations:  Abscent,  decelerations:  Absent UC:   irregular, every 5 minutes SVE:   Dilation: 5.5 Effacement (%): 80 Station: -2 Exam by:: Dr. Yemaya Barnier   Labs: Lab Results  Component Value Date   WBC 10.6 (H) 01/06/2019   HGB 14.2 01/06/2019   HCT 41.3 01/06/2019   MCV 83.9 01/06/2019   PLT 220 01/06/2019   Assessment / Plan: Induction of labor for high BMI, remote from delivery with persistent category 2 tracing, fetal tachycardia and minimal to absent variability, also with maternal fever and receiving antibiotics  Labor: Remote from delivery.  Will proceed with cesarean section for abnormal fetal heart tracing and remote from delivery.  I discussed with the patient the risks, benefits alnd alternatives of cesarean section including risks of bleeding, infection and damage to organs.  All her questions were answered and the patient agrees to proceed with the delivery.   Preeclampsia:  N/A Fetal Wellbeing:  Category II Pain Control:  Epidural I/D:  Maternal fever and fetal tachycardia, receiving clindamycin and gentamicin.  Anticipated MOD:  Cesarean delivery.   Elmer Merwin WAKURU, MD.  01/07/2019, 9:12 PM  

## 2019-01-07 NOTE — Progress Notes (Signed)
VSS Tracing reviewed remotely Cat 1 and reactive Foley still in place with plan to remove at 0200 and then start pitocin as indicated

## 2019-01-07 NOTE — Progress Notes (Signed)
Stacy Hudson is a 23 y.o. G1P0000 at 40w1dadmitted for induction of labor due to maternal obesity with BMI 41.  Subjective:  SROM 01/06/19 at 14:10 clear, Pitocin at 18 mU/min Comfortable with  Epidural and sound asleep Contractions every 2-4 minutes, lasting 45 seconds    Objective: BP (!) 104/57 (BP Location: Left Arm)   Pulse 81   Temp 98.1 F (36.7 C) (Oral)   Resp 18   Ht 5' 5.5" (1.664 m)   Wt 115.7 kg   LMP 03/18/2018   SpO2 99%   BMI 41.81 kg/m  No intake/output data recorded. Total I/O In: -  Out: 175 [Urine:175]  FHT:  Category 1 SVE:  4/80/-2 IUPC easily inserted: MVU 170-200   Labs: Lab Results  Component Value Date   WBC 10.6 (H) 01/06/2019   HGB 14.2 01/06/2019   HCT 41.3 01/06/2019   MCV 83.9 01/06/2019   PLT 220 01/06/2019    Assessment / Plan: Induction of labor due to maternal obesity,  progressing well on pitocin  Continue to titrate Pitocin up for adequate MVU Patient changed to left exaggerated Simms Fetal Wellbeing: reassuring Anticipated MOD:  NSVD  Katharine Look A Luay Balding 01/07/2019, 1:11 PM

## 2019-01-07 NOTE — Interval H&P Note (Signed)
History and Physical Interval Note:  01/07/2019 9:29 PM  [name]  has presented today for surgery, with the diagnosis of Abnormal fetal heart tracing and remote from delivery.  The various methods of treatment have been discussed with the patient and family. After consideration of risks, benefits and other options for treatment, the patient has consented to  Procedure(s): CESAREAN SECTION (N/A) as a surgical intervention.  The patient's history has been reviewed, patient examined, no change in status, stable for surgery.  I have reviewed the patient's chart and labs.  Questions were answered to the patient's satisfaction.    Alinda Dooms, MD.

## 2019-01-07 NOTE — Op Note (Addendum)
Patient: Stacy Hudson DOB: [birth year] MRN:  [id number]  DATE OF SURGERY: 01/07/2019   PREOP DIAGNOSIS:  1. Induction of labor at 40 weeks 1 day EGA for high BMI of 41 2. Prolonged rupture of membranes greater than 30 hours 3. Intraamniotic infection- Triple I evidenced by maternal fever and fetal tachycardia 4. Abnormal fetal heart tracing, with persistent fetal tachycardia and absent-minimal variability, persistent category 2 tracing 5.  Remote from delivery at 5.5 cm dilation.  POSTOP DIAGNOSIS: Same as above and  6. Moderate meconium stained amniotic fluid.   PROCEDURE: Primary low uterine segment transverse cesarean section via Pfannenstiel incision.     SURGEON: Dr.  Angus Palms Sallye Ober  ASSISTANT: Arlan Organ, CNM  ANESTHESIA: Epidural  COMPLICATIONS: None  FINDINGS: Viable female infant in cephalic presentation, ROT, weight pending, Apgar scores of 8 and 9. Normal uterus and fallopian tubes and ovaries bilaterally.    EBL:  999 cc  IV FLUID:  2 l LR   URINE OUTPUT: 50 cc clear urine  INDICATIONS:  23 Y/O G1P0 who presented for labor induction.  She got to 6 cm and had maternal fever of 103 degrees and fetal tachycardia to 180s with absent to minimal variability.  A cesarean delivery was called for abnormal fetal heart tracing and remote from delivery.  She was consented for the procedure after explaining risks benefits and alternatives of the procedure.    PROCEDURE:   Informed consent was obtained from the patient to undergo the procedure. She was taken to the operating room where her epidural anesthesia was found to be adequate. She was prepped and draped in the usual sterile fashion and a Foley catheter was placed. She received clindamycin and gentamicin preoperatively. A Pfannenstiel incision was made with the scalpel and the incision extended through the subcutaneous layer and also the fascia with the bovie. Small perforators in the subcutaneous layer were  contained with the Bovie. The fascia was nicked in the midline and then was further separated from the rectus muscles bilaterally using Mayo scissors. Kochers were placed inferiorly and then superiorly to allow further separation of fascia from the rectus muscles.  The peritoneal cavity was entered bluntly with the fingers. The Alexis retractor was placed in. The bladder flap was created using Metzenbaum scissors.   The uterus was incised with a scalpel and the incision extended bluntly bilaterally with fingers. Moderate meconium stained amniotic fluid was noted.  The head then the rest of the body was then delivered with abdominal pressure.  She delivered a viable female infant, apgar scores 8, 9.  The cord was clamped and cut after 1 minute. Cord blood was collected.    The uterus was not exteriorized.  The edges of the uterus was grasped with T clamps.  The placenta was delivered with gentle traction on the umbilical cord. The uterus was cleared of clots and debris with a lap. Uterine atony resolved with massage and IV pitocin.  The uterine incision was closed with #1 Vicryl in a running locked stitch. An imbricating layer of the same stitch was placed over the initial closure.  Irrigation was applied and suctioned out. Excellent hemostasis was noted over the incision.  The muscles and peritoneum were then reapproximated using chromic suture.  Fascia was closed using 0 Vicryl in a running stitch. The subcutaneous layer was irrigated and suctioned out. Small perforators were contained with the bovie.  The subcutaneous layer was closed using 1-0 plain in interrupted stitches. The skin was closed using  4-0 vicryl on a keith needle. Benzocaine and steri strips were applied.  Honeycomb was then applied. The patient was then cleaned and she was taken to the recovery room with her baby in stable conditions.   SPECIMEN: Placenta to pathology, umbilical cord blood to lab.   DISPOSITION: TO PACU, STABLE.   Dr.  Alesia Richards.

## 2019-01-07 NOTE — Progress Notes (Signed)
Stacy Hudson is a 23 y.o. G1P0000 at 57w1dadmitted for induction of labor due to maternal obesity BMI 41.   Subjective:  Comfortable with  Epidural IUPC with MVU decreasing on Pitocin 24 mU/min: 210 to 180 to 130 to 80     Objective: BP (!) 121/56   Pulse 96   Temp 99.9 F (37.7 C) (Oral)   Resp 16   Ht 5' 5.5" (1.664 m)   Wt 115.7 kg   LMP 03/18/2018   SpO2 99%   BMI 41.81 kg/m  No intake/output data recorded. Total I/O In: -  Out: 700 [Urine:700]  FHT:  FHR: 150-165 bpm, variability: minimal ,  accelerations:  Abscent,  decelerations:  Absent MVU: 80-100 SVE:   Dilation: 6 Effacement (%): 90 Station: -2 Exam by:: MD Kiarah Eckstein  Labs: Lab Results  Component Value Date   WBC 10.6 (H) 01/06/2019   HGB 14.2 01/06/2019   HCT 41.3 01/06/2019   MCV 83.9 01/06/2019   PLT 220 01/06/2019    Assessment / Plan: Induction of labor due to maternal obesity,  progressing slowly on pitocin Fetal Wellbeing: reassuring   SROM at 14:00 on 01/06/19 (29 hours): GBS negative Pitocin since 2:00 today (17 hours) Maternal fever x1 100.3 resolved with tylenol: to monitor closely and start ATB if recurs Now fetal mild tachycardia with minimal variability: to monitor closely  IUPC changed Pitocin reduced to 12 mU/min  Stacy Hudson 01/07/2019, 6:34 PM

## 2019-01-07 NOTE — Progress Notes (Signed)
Stacy Hudson is a 23 y.o. G1P0000 at [redacted]w[redacted]d by ultrasound admitted for IOL 2/2 BM! 53, term.   SROM 1410 on 9/21 Cervical ripening with misoprostol and cervical balloon then Pitocin. IUPC placed and initially working well then MVU's decreased and ctx minimal amplitude noted, IUPC replaced by OB. Resumed care of patient at Subjective: Patient having chills/shivering, APAP 1 gm at 1600 given, now RN reports T103.  Patient is anxious and crying easily. FOB at [facility] and supportive.   Objective: Vitals:   01/07/19 1815 01/07/19 1830 01/07/19 1900 01/07/19 1905  BP:  114/76 (!) 113/49   Pulse:  (!) 102 (!) 145   Resp:  18    Temp: 99.9 F (37.7 C)   (!) 103.1 F (39.5 C)  TempSrc: Oral   Oral  SpO2:    100%  Weight:      Height:        I/O last 3 completed shifts: In: -  Out: 975 [Urine:975] No intake/output data recorded.   FHT:  FHR: 180 bpm, variability: minimal ,  accelerations:  Abscent,  decelerations:  Absent UC:   Minimal amplitude on IUPC with ctx, IUPC replaced yet a third time, good response with valsalva maneuver, so expect ctx reading is accurate SVE:   Dilation: 6 Effacement (%): 90 Station: -2 Exam by:: Melina Copa CNM + caput and molding  Labs:   Recent Labs    01/06/19 0046  WBC 10.6*  HGB 14.2  HCT 41.3  PLT 220    Assessment / Plan: Protracted active phase, PROM x 19 hrs Maternal fever and fetal tachy c/w chorio Start dual ABX tx - Clinda 900 mg and Gent per body weight titration APAP 1 gm q 6 hrs  Labor: Pitocin at half dose, consider Pitocin rest for next couple hours.   Reposition with peanut ball in modified Walcher's to encourage fetal descent Anticipate uterine atony at birth, will T&C for 2 units PRBC Preeclampsia:  no signs or symptoms of toxicity Fetal Wellbeing:  Category II Pain Control:  Epidural I/D:  Chorio presumed, ABX per guidelines Anticipated MOD:  guarded   Will update Dr. Alesia Richards on patient status.   Juliene Pina,  CNM, MSN 01/07/2019, 7:50 PM

## 2019-01-07 NOTE — Progress Notes (Signed)
Stacy Hudson is a 23 y.o. G1P0000 at [redacted]w[redacted]d admitted for labor induction for high BMI  Subjective: Patient comfortable with epidural, c/o of mid upper back pain with positional changes.   Objective: BP (!) 111/55   Pulse (!) 128   Temp (!) 103.1 F (39.5 C) (Oral)   Resp 20   Ht 5' 5.5" (1.664 m)   Wt 115.7 kg   LMP 03/18/2018   SpO2 99%   BMI 41.81 kg/m  I/O last 3 completed shifts: In: -  Out: 975 [Urine:975] Total I/O In: -  Out: 100 [Urine:100]  FHT:  FHR: 180 bpm, variability: minimal ,  accelerations:  Abscent,  decelerations:  Absent UC:   irregular, every 5 minutes SVE:   Dilation: 5.5 Effacement (%): 80 Station: -2 Exam by:: Dr. Alesia Richards   Labs: Lab Results  Component Value Date   WBC 10.6 (H) 01/06/2019   HGB 14.2 01/06/2019   HCT 41.3 01/06/2019   MCV 83.9 01/06/2019   PLT 220 01/06/2019   Assessment / Plan: Induction of labor for high BMI, remote from delivery with persistent category 2 tracing, fetal tachycardia and minimal to absent variability, also with maternal fever and receiving antibiotics  Labor: Remote from delivery.  Will proceed with cesarean section for abnormal fetal heart tracing and remote from delivery.  I discussed with the patient the risks, benefits alnd alternatives of cesarean section including risks of bleeding, infection and damage to organs.  All her questions were answered and the patient agrees to proceed with the delivery.   Preeclampsia:  N/A Fetal Wellbeing:  Category II Pain Control:  Epidural I/D:  Maternal fever and fetal tachycardia, receiving clindamycin and gentamicin.  Anticipated MOD:  Cesarean delivery.   Alinda Dooms, MD.  01/07/2019, 9:12 PM

## 2019-01-07 NOTE — Anesthesia Procedure Notes (Signed)
Epidural Patient location during procedure: OB Start time: 01/07/2019 8:30 AM End time: 01/07/2019 8:45 AM  Staffing Anesthesiologist: Freddrick March, MD Performed: anesthesiologist   Preanesthetic Checklist Completed: patient identified, pre-op evaluation, timeout performed, IV checked, risks and benefits discussed and monitors and equipment checked  Epidural Patient position: sitting Prep: site prepped and draped and DuraPrep Patient monitoring: continuous pulse ox, blood pressure, heart rate and cardiac monitor Approach: midline Location: L3-L4 Injection technique: LOR air  Needle:  Needle type: Tuohy  Needle gauge: 17 G Needle length: 9 cm Needle insertion depth: 7 cm Catheter type: closed end flexible Catheter size: 19 Gauge Catheter at skin depth: 13 cm Test dose: negative  Assessment Sensory level: T8 Events: blood not aspirated, injection not painful, no injection resistance, negative IV test and no paresthesia  Additional Notes Patient identified. Risks/Benefits/Options discussed with patient including but not limited to bleeding, infection, nerve damage, paralysis, failed block, incomplete pain control, headache, blood pressure changes, nausea, vomiting, reactions to medication both or allergic, itching and postpartum back pain. Confirmed with bedside nurse the patient's most recent platelet count. Confirmed with patient that they are not currently taking any anticoagulation, have any bleeding history or any family history of bleeding disorders. Patient expressed understanding and wished to proceed. All questions were answered. Sterile technique was used throughout the entire procedure. Please see nursing notes for vital signs. Test dose was given through epidural catheter and negative prior to continuing to dose epidural or start infusion. Warning signs of high block given to the patient including shortness of breath, tingling/numbness in hands, complete motor block,  or any concerning symptoms with instructions to call for help. Patient was given instructions on fall risk and not to get out of bed. All questions and concerns addressed with instructions to call with any issues or inadequate analgesia.  Reason for block:procedure for pain

## 2019-01-07 NOTE — Progress Notes (Signed)
Stacy Hudson is Stacy 23 y.o. G1P0000 at 54w1dadmitted for induction of labor due to maternal obesity with BMI 41.  Subjective:  SROM 01/06/19 at 14:10 clear, Pitocin at 16 mU/min Comfortable with  Epidural and sound asleep Contractions every 2-4 minutes, lasting 45 seconds    Objective: BP 114/60 (BP Location: Right Arm)   Pulse 74   Temp 99 F (37.2 C) (Oral)   Resp 16   Ht 5' 5.5" (1.664 m)   Wt 115.7 kg   LMP 03/18/2018   SpO2 99%   BMI 41.81 kg/m  No intake/output data recorded. Total I/O In: -  Out: 125 [Urine:125]  FHT:  Category 1 SVE:   Dilation: 3 Effacement (%): 80 Station: -1 Exam by:: Dorinda Hill RN  Labs: Lab Results  Component Value Date   WBC 10.6 (H) 01/06/2019   HGB 14.2 01/06/2019   HCT 41.3 01/06/2019   MCV 83.9 01/06/2019   PLT 220 01/06/2019    Assessment / Plan: Induction of labor due to maternal obesity,  progressing well on pitocin Fetal Wellbeing: reassuring Anticipated MOD:  NSVD  Stacy Hudson Stacy Hudson 01/07/2019, 11:07 AM

## 2019-01-07 NOTE — Progress Notes (Signed)
Subjective:   Comfortable w/ IV sedation.   Objective:   VS: BP 113/74   Pulse 87   Temp 98.2 F (36.8 C) (Oral)   Resp 18   Ht 5' 5.5" (1.664 m)   Wt 115.7 kg   LMP 03/18/2018   BMI 41.81 kg/m     FHR : baseline 150 / variability mod / accelerations present / absent decelerations Toco: contractions irreg Membranes: SROM, light med fluid Dilation: 2 Effacement (%): 70 Cervical Position: Middle Station: -2 Presentation: Vertex Exam by:: CNM V Grice Foley balloon removed.    Assessment /Plan:   Latent labor FHR category 1 Pitocin started GBS Neg Pain mngt IV sedation, Epidural PRN Anticipate NSVD    Arrie Eastern CNM, MSN9/22/2020, 3:36 AM

## 2019-01-07 NOTE — Transfer of Care (Signed)
Immediate Anesthesia Transfer of Care Note  Patient: Stacy Hudson  Procedure(s) Performed: CESAREAN SECTION (N/A )  Patient Location: PACU  Anesthesia Type:Epidural  Level of Consciousness: awake  Airway & Oxygen Therapy: Patient Spontanous Breathing  Post-op Assessment: Report given to RN and Post -op Vital signs reviewed and stable  Post vital signs: stable  Last Vitals:  Vitals Value Taken Time  BP 118/65 01/07/19 2308  Temp    Pulse 119 01/07/19 2308  Resp 14 01/07/19 2309  SpO2 99 % 01/07/19 2308  Vitals shown include unvalidated device data.  Last Pain:  Vitals:   01/07/19 2015  TempSrc:   PainSc: Asleep         Complications: No apparent anesthesia complications

## 2019-01-07 NOTE — Anesthesia Preprocedure Evaluation (Signed)
Anesthesia Evaluation  Patient identified by MRN, date of birth, ID band Patient awake    Reviewed: Allergy & Precautions, NPO status , Patient's Chart, lab work & pertinent test results  Airway Mallampati: III  TM Distance: >3 FB Neck ROM: Full    Dental no notable dental hx. (+) Teeth Intact, Dental Advisory Given   Pulmonary neg pulmonary ROS, former smoker,    Pulmonary exam normal breath sounds clear to auscultation       Cardiovascular negative cardio ROS Normal cardiovascular exam Rhythm:Regular Rate:Normal     Neuro/Psych PSYCHIATRIC DISORDERS Anxiety Depression negative neurological ROS     GI/Hepatic negative GI ROS, Neg liver ROS,   Endo/Other  Morbid obesity  Renal/GU negative Renal ROS  negative genitourinary   Musculoskeletal negative musculoskeletal ROS (+)   Abdominal   Peds  Hematology negative hematology ROS (+)   Anesthesia Other Findings   Reproductive/Obstetrics (+) Pregnancy                             Anesthesia Physical Anesthesia Plan  ASA: III  Anesthesia Plan: Epidural   Post-op Pain Management:    Induction:   PONV Risk Score and Plan: 2 and Treatment may vary due to age or medical condition  Airway Management Planned: Natural Airway  Additional Equipment:   Intra-op Plan:   Post-operative Plan:   Informed Consent: I have reviewed the patients History and Physical, chart, labs and discussed the procedure including the risks, benefits and alternatives for the proposed anesthesia with the patient or authorized representative who has indicated his/her understanding and acceptance.       Plan Discussed with: Anesthesiologist  Anesthesia Plan Comments: (Patient identified. Risks, benefits, options discussed with patient including but not limited to bleeding, infection, nerve damage, paralysis, failed block, incomplete pain control, headache, blood  pressure changes, nausea, vomiting, reactions to medication, itching, and post partum back pain. Confirmed with bedside nurse the patient's most recent platelet count. Confirmed with the patient that they are not taking any anticoagulation, have any bleeding history or any family history of bleeding disorders. Patient expressed understanding and wishes to proceed. All questions were answered. )        Anesthesia Quick Evaluation

## 2019-01-07 NOTE — Brief Op Note (Signed)
01/07/2019  11:08 PM  PATIENT:  Stacy Hudson  23 y.o. female  PRE-OPERATIVE DIAGNOSIS:  Primary unscheduled cesarean section; fetal tachycardia, abnormal fetal heart tracing remote from delivery, maternal fever, induction of labor at 40.1 weeks for high BMI  POST-OPERATIVE DIAGNOSIS:  Primary unscheduled cesarean section; fetal tachycardia, abnormal fetal heart tracing remote from delivery, maternal fever, induction of labor at 40.1 weeks for high BMI, moderate meconium.   PROCEDURE:  Procedure(s): CESAREAN SECTION (N/A)  SURGEON:  Surgeon(s) and Role:    * Waymon Amato, MD - Primary  ASSISTANTS: Derrell Lolling, CNM   ANESTHESIA:   epidural  EBL: 999 ml   BLOOD ADMINISTERED:none  DRAINS: none   LOCAL MEDICATIONS USED:  NONE  SPECIMEN:  Source of Specimen:  Placenta to Pathology. Cord blood to lab.   DISPOSITION OF SPECIMEN:  As above.   COUNTS:  YES  TOURNIQUET:  * No tourniquets in log *  DICTATION: .Note written in Ord: Admit to inpatient   PATIENT DISPOSITION:  PACU - hemodynamically stable.   Delay start of Pharmacological VTE agent (>24hrs) due to surgical blood loss or risk of bleeding: not applicable  Dr. Alesia Richards.  01/07/2019.

## 2019-01-07 NOTE — Progress Notes (Addendum)
Subjective:   Doing well, reports minimal pain w/ctx. Desires to use IV analgesia for labor pain mngmt.   Objective:   VS: BP 117/69   Pulse 61   Temp 98 F (36.7 C) (Oral)   Resp 18   Ht 5' 5.5" (1.664 m)   Wt 115.7 kg   LMP 03/18/2018   BMI 41.81 kg/m  FHR : baseline 135 / variability mod / accelerations present / absent decelerations Toco: contractions every 2-4, mild to palpation Membranes: SROM, light meconium Dilation: 2.5 Effacement (%): 70 Cervical Position: Middle Station: -2 Presentation: Vertex Exam by:: CNM V Grice Pitocin 10 mU/min  Assessment /Plan:  Latent labor FHR category 1 Pitocin -IOL  GBS-neg SROM x 17.5 hrs Pain mngt IV analgesia Anticipate NSVD    Stacy Hudson CNM, MSN9/22/2020, 6:54 AM

## 2019-01-08 ENCOUNTER — Encounter (HOSPITAL_COMMUNITY): Payer: Self-pay

## 2019-01-08 DIAGNOSIS — D72829 Elevated white blood cell count, unspecified: Secondary | ICD-10-CM | POA: Diagnosis not present

## 2019-01-08 LAB — CREATININE, SERUM
Creatinine, Ser: 0.9 mg/dL (ref 0.44–1.00)
GFR calc Af Amer: >60 mL/min (ref >60)
GFR calc non Af Amer: >60 mL/min (ref >60)

## 2019-01-08 LAB — CBC
HCT: 33.2 % — ABNORMAL LOW (ref 36.0–46.0)
Hemoglobin: 10.9 g/dL — ABNORMAL LOW (ref 12.0–15.0)
MCH: 28.4 pg (ref 26.0–34.0)
MCHC: 32.8 g/dL (ref 30.0–36.0)
MCV: 86.5 fL (ref 80.0–100.0)
Platelets: 219 10*3/uL (ref 150–400)
RBC: 3.84 MIL/uL — ABNORMAL LOW (ref 3.87–5.11)
RDW: 16.3 % — ABNORMAL HIGH (ref 11.5–15.5)
WBC: 35.1 10*3/uL — ABNORMAL HIGH (ref 4.0–10.5)
nRBC: 0 % (ref 0.0–0.2)

## 2019-01-08 MED ORDER — CLINDAMYCIN PHOSPHATE 900 MG/50ML IV SOLN
900.0000 mg | Freq: Three times a day (TID) | INTRAVENOUS | Status: DC
  Administered 2019-01-08 (×3): 900 mg via INTRAVENOUS
  Filled 2019-01-08 (×5): qty 50

## 2019-01-08 MED ORDER — ENOXAPARIN SODIUM 60 MG/0.6ML ~~LOC~~ SOLN
0.5000 mg/kg | SUBCUTANEOUS | Status: DC
  Administered 2019-01-08: 60 mg via SUBCUTANEOUS
  Filled 2019-01-08 (×2): qty 0.6

## 2019-01-08 MED ORDER — SIMETHICONE 80 MG PO CHEW
80.0000 mg | CHEWABLE_TABLET | ORAL | Status: DC | PRN
  Administered 2019-01-08 – 2019-01-09 (×6): 80 mg via ORAL
  Filled 2019-01-08 (×4): qty 1

## 2019-01-08 MED ORDER — SENNOSIDES-DOCUSATE SODIUM 8.6-50 MG PO TABS
2.0000 | ORAL_TABLET | ORAL | Status: DC
  Administered 2019-01-08 – 2019-01-09 (×2): 2 via ORAL
  Filled 2019-01-08 (×2): qty 2

## 2019-01-08 MED ORDER — GENTAMICIN SULFATE 40 MG/ML IJ SOLN
5.0000 mg/kg | INTRAVENOUS | Status: DC
  Administered 2019-01-08: 410 mg via INTRAVENOUS
  Filled 2019-01-08: qty 10.25

## 2019-01-08 MED ORDER — LACTATED RINGERS IV BOLUS
500.0000 mL | Freq: Once | INTRAVENOUS | Status: AC
  Administered 2019-01-08: 500 mL via INTRAVENOUS

## 2019-01-08 MED ORDER — ZOLPIDEM TARTRATE 5 MG PO TABS: 5.0000 mg | ORAL_TABLET | Freq: Every evening | ORAL | Status: DC | PRN

## 2019-01-08 MED ORDER — OXYCODONE HCL 5 MG PO TABS
5.0000 mg | ORAL_TABLET | ORAL | Status: DC | PRN
  Administered 2019-01-09 (×2): 5 mg via ORAL
  Filled 2019-01-08 (×2): qty 1

## 2019-01-08 MED ORDER — LACTATED RINGERS IV SOLN
INTRAVENOUS | Status: DC
  Administered 2019-01-08: 04:00:00 via INTRAVENOUS

## 2019-01-08 MED ORDER — IBUPROFEN 800 MG PO TABS
800.0000 mg | ORAL_TABLET | Freq: Three times a day (TID) | ORAL | Status: DC
  Administered 2019-01-08 – 2019-01-10 (×7): 800 mg via ORAL
  Filled 2019-01-08 (×7): qty 1

## 2019-01-08 MED ORDER — DIBUCAINE (PERIANAL) 1 % EX OINT: 1.0000 "application " | TOPICAL_OINTMENT | CUTANEOUS | Status: DC | PRN

## 2019-01-08 MED ORDER — OXYTOCIN 40 UNITS IN NORMAL SALINE INFUSION - SIMPLE MED: 2.5000 [IU]/h | INTRAVENOUS | Status: AC

## 2019-01-08 MED ORDER — COCONUT OIL OIL
1.0000 "application " | TOPICAL_OIL | Status: DC | PRN
  Administered 2019-01-09: 1 via TOPICAL

## 2019-01-08 MED ORDER — ACETAMINOPHEN 500 MG PO TABS
1000.0000 mg | ORAL_TABLET | Freq: Four times a day (QID) | ORAL | Status: DC
  Administered 2019-01-08 – 2019-01-10 (×8): 1000 mg via ORAL
  Filled 2019-01-08 (×10): qty 2

## 2019-01-08 MED ORDER — MENTHOL 3 MG MT LOZG: 1.0000 | LOZENGE | OROMUCOSAL | Status: DC | PRN

## 2019-01-08 MED ORDER — INFLUENZA VAC SPLIT QUAD 0.5 ML IM SUSY
0.5000 mL | PREFILLED_SYRINGE | Freq: Once | INTRAMUSCULAR | Status: DC
  Filled 2019-01-08: qty 0.5

## 2019-01-08 MED ORDER — WITCH HAZEL-GLYCERIN EX PADS: 1.0000 "application " | MEDICATED_PAD | CUTANEOUS | Status: DC | PRN

## 2019-01-08 MED ORDER — DIPHENHYDRAMINE HCL 25 MG PO CAPS: 25.0000 mg | ORAL_CAPSULE | Freq: Four times a day (QID) | ORAL | Status: DC | PRN

## 2019-01-08 MED ORDER — LACTATED RINGERS IV BOLUS: 500.0000 mL | Freq: Once | INTRAVENOUS | Status: DC

## 2019-01-08 MED ORDER — PRENATAL MULTIVITAMIN CH
1.0000 | ORAL_TABLET | Freq: Every day | ORAL | Status: DC
  Administered 2019-01-08 – 2019-01-10 (×3): 1 via ORAL
  Filled 2019-01-08 (×3): qty 1

## 2019-01-08 NOTE — Progress Notes (Signed)
Removed IV due to infiltration.  Midwife notified, antibiotics discontinued. Will continue to monitor.

## 2019-01-08 NOTE — Anesthesia Postprocedure Evaluation (Signed)
Anesthesia Post Note  Patient: Stacy Hudson  Procedure(s) Performed: CESAREAN SECTION (N/A )     Patient location during evaluation: PACU Anesthesia Type: Epidural Level of consciousness: oriented and awake and alert Pain management: pain level controlled Vital Signs Assessment: post-procedure vital signs reviewed and stable Respiratory status: spontaneous breathing, respiratory function stable and nonlabored ventilation Cardiovascular status: blood pressure returned to baseline and stable Postop Assessment: no headache, no backache, no apparent nausea or vomiting and epidural receding Anesthetic complications: no    Last Vitals:  Vitals:   01/08/19 0208 01/08/19 0302  BP: 102/60 113/72  Pulse: 70 82  Resp: 18 18  Temp: 36.7 C 36.5 C  SpO2: 99% 98%    Last Pain:  Vitals:   01/08/19 0302  TempSrc: Oral  PainSc: 0-No pain   Pain Goal:                   Lidia Collum

## 2019-01-08 NOTE — Progress Notes (Signed)
V. Stacy Hudson update on patient's status and output . Will tell patient to make a lap around the hall and encourage fluids.

## 2019-01-08 NOTE — Progress Notes (Signed)
Subjective: POD# 1 Live born female  Birth Weight: 8 lb 11.3 oz (3950 g) APGAR: 8, 9  Newborn Delivery   Birth date/time: 01/07/2019 22:16:00 Delivery type: C-Section, Low Transverse Trial of labor: No C-section categorization: Primary     Baby name: Stacy Hudson Delivering provider: Alesia Richards, EMA   Feeding: breast  Pain control at delivery: Epidural   Reports feeling well, back pain much improved since delivery.  Patient reports tolerating PO.   Breast symptoms: working on latch, using NS Pain controlled with PO meds Denies HA/SOB/C/P/N/V/dizziness. Flatus present. She reports vaginal bleeding as normal, without clots.  She has ambulated to BR for pericare, foley cath in place.     Objective:   VS:    Vitals:   01/08/19 0105 01/08/19 0208 01/08/19 0302 01/08/19 0400  BP: 109/69 102/60 113/72   Pulse: 85 70 82   Resp: 18 18 18 20   Temp: 98.6 F (37 C) 98.1 F (36.7 C) 97.7 F (36.5 C) 98.2 F (36.8 C)  TempSrc: Oral Oral Oral Oral  SpO2: 97% 99% 98% 98%  Weight:      Height:          Intake/Output Summary (Last 24 hours) at 01/08/2019 0611 Last data filed at 01/08/2019 0400 Gross per 24 hour  Intake 2900 ml  Output 2824 ml  Net 76 ml        Recent Labs    01/06/19 0046 01/08/19 0459  WBC 10.6* 35.1*  HGB 14.2 10.9*  HCT 41.3 33.2*  PLT 220 219     Blood type: --/--/O POS, O POS Performed at [facility] [address] [place], [place] [address]  [phone])  Rubella: Immune (01/28 0000)  Vaccines: TDaP UTD         Flu    ordered   Physical Exam:  General: alert, cooperative and no distress CV: Regular rate and rhythm Resp: clear Abdomen: soft, nontender, normal bowel sounds Incision: clean, dry and intact Uterine Fundus: firm, below umbilicus, nontender Lochia: minimal Ext: no edema, redness or tenderness in the calves or thighs      Assessment/Plan: 23 y.o.   POD# 1. G1P1001                  Principal Problem:   Postpartum care  following cesarean delivery 9/22 Active Problems:   Obesity complicating pregnancy, third trimester   Cesarean delivery delivered   Leukocytosis / chorio  - afebrile since delivery  - continue dual ABX regimen until at least 24 hrs afebrile  Doing well, stable.              - heparin per protocol for DVT prophylaxis, BMI > 40 Advance diet as tolerated Encourage rest when baby rests Breastfeeding support Encourage to ambulate Routine post-op care  Stacy Hudson, CNM, MSN 01/08/2019, 6:11 AM

## 2019-01-08 NOTE — Progress Notes (Signed)
CSW received consult for history of anxiety and depression.  CSW met with MOB to offer support and complete assessment.    MOB sitting up in bed cradling infant in arms with FOB present at bedside, when CSW entered the room. CSW introduced self and received verbal permission from MOB to complete assessment with FOB present. MOB and FOB both pleasant and engaged throughout assessment and were appropriate and attentive to infant during visit. MOB appeared to be mildly anxious and unsure but reported feeling ok. CSW inquired about MOB's mental health history and MOB acknowledged experiencing depression following the death of her mother in 2014. MOB explained after that she began to experience anxiety gradually and felt that things peaked in 2016 when she started to develop panic attacks. MOB acknowledged experiencing anxiety, seasonal depression and panic attacks during her pregnancy noting 3 panic attacks in the last month. MOB shared most of them were in the presence of FOB and that he is a great support for her. CSW inquired about if MOB has ever taken medications, participated in counseling or utilized coping skills when symptoms came up and MOB denied. CSW inquired about if this was something she was interested in and MOB again denied and just requested that CSW leave handouts for her to assist in the postpartum period. MOB unsure if her pregnancy Medicaid would cover mental health services and CSW encouraged MOB to reach out to her Medicaid worker to see if she would qualify for standard Medicaid. MOB agreeable. CSW left MOB with New Mom Checklist from Postpartum Progress and left her with a list of counseling resources in the event she would like to see counseling. CSW provided education regarding the baby blues period vs. perinatal mood disorders, discussed treatment and gave resources for mental health follow up if concerns arise. CSW encouraged MOB to contact a medical professional if symptoms are noted at  any time. MOB denied any current SI or HI and reported having good support from FOB, her grandmother, and some close friends.  MOB and FOB confirmed having all essential items for infant once discharged and stated infant would be sleeping in a bassinet once home. CSW provided review of Sudden Infant Death Syndrome (SIDS) precautions and safe sleeping habits.    CSW identifies no further need for intervention and no barriers to discharge at this time.  Yorel Redder, LCSWA  [facility] [phone]   

## 2019-01-08 NOTE — Lactation Note (Addendum)
This note was copied from a baby's chart. Lactation Consultation Note  Patient Name: Stacy Hudson [id number] Date: 01/08/2019 Reason for consult: Follow-up assessment   P1, Baby 7 hours old.  Mother requested assistance w/ breastfeeding. She had been using NS. Suggest attempting without NS. Prepumped with manual pump. Mother needs to work on maintaining depth.  Helped her with football hold, waiting for wide gap, chin first, hands at base of neck.  Baby sustained latch for 25 min. Encouraged breast compression.   Baby came off with lipstick shaped nipple.  Advised to guide baby deeper on breast and take her off if she slips down.  Mother had recently pumped 10 ml with hand pump.  She has good flow. Demonstrated how to finger syringe feed.  Recommend mother post pump a few times per day for 10-20 min with DEBP on initiation setting.  Mother wants to eventually give a few bottles of breastmilk. Suggest waiting until 3 weeks after breastfeeding is well establish to start bottle feeding. Advised giving baby back volume pumped at the next feeding. Reviewed cleaning and milk storage.  Feed on demand with cues.  Goal 8-12+ times per day after first 24 hrs.  Place baby STS if not cueing.     Maternal Data Has patient been taught Hand Expression?: Yes Does the patient have breastfeeding experience prior to this delivery?: No  Feeding Feeding Type: Breast Fed  LATCH Score Latch: Grasps breast easily, tongue down, lips flanged, rhythmical sucking.  Audible Swallowing: A few with stimulation  Type of Nipple: Everted at rest and after stimulation  Comfort (Breast/Nipple): Soft / non-tender  Hold (Positioning): Assistance needed to correctly position infant at breast and maintain latch.  LATCH Score: 8  Interventions Interventions: Breast feeding basics reviewed;Assisted with latch;Hand express;Pre-pump if needed;DEBP;Support pillows;Breast compression;Adjust  position  Lactation Tools Discussed/Used Pump Review: Setup, frequency, and cleaning;Milk Storage Initiated by:: Vivianne Master RN IBCLC Date initiated:: 01/08/19   Consult Status Consult Status: Follow-up Date: 01/09/19 Follow-up type: In-patient    Vivianne Master [facility] 01/08/2019, 2:50 PM

## 2019-01-08 NOTE — Progress Notes (Signed)
Right forearm swelling went  Down will monitor.

## 2019-01-08 NOTE — Progress Notes (Addendum)
[name] [phone] Postpartum Postoperative Day # 2  Keyondra L Williams-Lynch, G1P1001, [redacted]w[redacted]d, S/P Primary LT Cesarean Section due to unscheduled cesarean section; fetal tachycardia, abnormal fetal heart tracing remote from delivery, maternal fever, induction of labor at 40.1 weeks for high BMI.   Subjective: Patient up ad lib, denies syncope or dizziness. Reports consuming regular diet without issues and denies N/V. Patient reports 0 bowel movement + passing flatus.  Denies issues with urination and reports bleeding is "lighter."  Patient is breastfeeding and reports going well.  Desires undecided for postpartum contraception.  Pain is being appropriately managed with use of po meds. Post op QBL was 940mls, hgb dropped from 14.2-10.9, denies s/sx of anemia.   Objective: Patient Vitals for the past 24 hrs:  BP Temp Temp src Pulse Resp SpO2  01/08/19 2139 (!) 100/51 97.7 F (36.5 C) Oral 78 20 99 %  01/08/19 1726 (!) 102/52 97.9 F (36.6 C) - 68 18 98 %  01/08/19 1416 (!) 116/56 97.9 F (36.6 C) Oral 78 18 99 %  01/08/19 0947 107/68 98.2 F (36.8 C) Oral 70 18 98 %     Physical Exam:  General: alert, cooperative, appears stated age and no distress Mood/Affect: Happy Lungs: clear to auscultation, no wheezes, rales or rhonchi, symmetric air entry.  Heart: normal rate, regular rhythm, normal S1, S2, no murmurs, rubs, clicks or gallops. Breast: breasts appear normal, no suspicious masses, no skin or nipple changes or axillary nodes. Abdomen:  + bowel sounds, soft, non-tender Incision: healing well, no significant drainage, no dehiscence, no significant erythema, Honeycomb dressing  Uterine Fundus: firm, involution -1 Lochia: appropriate Skin: Warm, Dry. DVT Evaluation: No evidence of DVT seen on physical exam. Negative Homan's sign. No cords or calf tenderness. No significant calf/ankle edema.  Labs: Recent Labs    01/08/19 0459  HGB 10.9*  HCT 33.2*  WBC 35.1*     CBG (last 3)  No results for input(s): GLUCAP in the last 72 hours.   I/O: I/O last 3 completed shifts: In: 1443 [P.O.:440; I.V.:3373; IV Piggyback:800] Out: 3284 [Urine:2285; Blood:999]   Assessment Postpartum Postoperative Day # 2. Ireta L Williams-Lynch, G1P1001, [redacted]w[redacted]d, S/P Primary LT Cesarean Section due to unscheduled cesarean section; fetal tachycardia, abnormal fetal heart tracing remote from delivery, maternal fever, induction of labor at 40.1 weeks for high BMI. Temp of 103 @ 0705 on 9/22, now over 24 hour afebrile, abx stopped last night @ 2200. Was on clinda/Gent. Pt stable. -1 Involution. breastFeeding. Hemodynamically Stable, post -op QBL 918mls, no s/sx of anemia, hgb drop of 14.2-10.9. Baby Female.   Plan: Continue other mgmt as ordered Chorio: Monitor BP.  VTE Prophylactics: SCD, ambulated as tolerates.  Pain control: Motrin/Tylenol/Narcotics PRN Education given regarding options for contraception, including barrier methods, injectable contraception, IUD placement, oral contraceptives.  Plan for discharge tomorrow, Breastfeeding and Lactation consult  Dr. Alesia Richards to be updated on patient status when she assumes care of pt @ 0700.  [facility] NP-C, CNM 01/09/2019, 4:55 AM   Attestation of Attending Supervision of Advanced Practitioner (CNM/NP): Evaluation and management procedures were performed by the Advanced Practitioner under my supervision and collaboration.  I have reviewed the Advanced Practitioner's note and chart, and I agree with the management and plan.I saw and examined patient at bedside and agree with above findings, assessment and plan as outlined above by [facility].  Dr. Alesia Richards 01/09/2019.

## 2019-01-08 NOTE — Lactation Note (Signed)
This note was copied from a baby's chart. Lactation Consultation Note  Patient Name: Stacy Hudson NIOEV'O Date: 01/08/2019   Baby 61 hours old.  Mother states baby is breastfeeding well but had some questions. Mother eating lunch.  Suggest mother call for LC to assist once baby cues. Mom made aware of O/P services, breastfeeding support groups, community resources, and our phone # for post-discharge questions.       Maternal Data    Feeding Feeding Type: Breast Fed  LATCH Score Latch: Repeated attempts needed to sustain latch, nipple held in mouth throughout feeding, stimulation needed to elicit sucking reflex.  Audible Swallowing: Spontaneous and intermittent  Type of Nipple: Everted at rest and after stimulation  Comfort (Breast/Nipple): Soft / non-tender  Hold (Positioning): Assistance needed to correctly position infant at breast and maintain latch.  LATCH Score: 8  Interventions    Lactation Tools Discussed/Used     Consult Status      Stacy Hudson [facility] 01/08/2019, 11:47 AM

## 2019-01-08 NOTE — Progress Notes (Signed)
Patient took a lap around hallway. Patient did well. Right forearm seemed a little edematous. Patient requested IV be Hep welled. Patient agreed to drink water every hour. IV flushed and patent. No leaking or increased pain at site. Will keep IV in for Antibiotics later. Patient has a compress on the right forearm. Foley taken out per Burman Foster without difficulty. Urine more yellow now as opposed to amber colored.

## 2019-01-08 NOTE — Progress Notes (Signed)
MOM up in chair for the past hour. Mom told to drink one cup of water and hour. Mom told to ambulate halls. Bolus infusing now. Clois Dupes aware of patient's status.

## 2019-01-08 NOTE — Progress Notes (Signed)
Left message with Burman Foster CNM about  Patient being off on her urine output about 50cc . Waiting for response.

## 2019-01-09 NOTE — Plan of Care (Signed)
  Problem: Activity: Goal: Risk for activity intolerance will decrease Outcome: Progressing   Problem: Activity: Goal: Ability to tolerate increased activity will improve Outcome: Progressing   Patient encouraged to walk around in room and hallways to increase activity. She reports moving around better today to the bathroom and in the room.   Problem: Coping: Goal: Level of anxiety will decrease Outcome: Progressing  Patient seems to be visibly anxious, along with anxiety in her problem list. RN answered all of patients questions and provided reassurance of patient status and her ability to care for baby. She and dad have a good understanding and knowledge of babies care and are caring for baby appropriately.    Problem: Pain Managment: Goal: General experience of comfort will improve Outcome: Progressing Managing pain level per MAR. Main complaint of gas "bubbles" and gas pain.

## 2019-01-10 LAB — TYPE AND SCREEN
ABO/RH(D): O POS
Antibody Screen: NEGATIVE
Unit division: 0
Unit division: 0

## 2019-01-10 LAB — BPAM RBC
Blood Product Expiration Date: 202010242359
Blood Product Expiration Date: 202010242359
ISSUE DATE / TIME: 202009221709
ISSUE DATE / TIME: 202009221709
Unit Type and Rh: 5100
Unit Type and Rh: 5100

## 2019-01-10 LAB — SURGICAL PATHOLOGY

## 2019-01-10 MED ORDER — OXYCODONE HCL 5 MG PO TABS: 5.0000 mg | ORAL_TABLET | ORAL | 0 refills | Status: AC | PRN

## 2019-01-10 MED ORDER — IBUPROFEN 800 MG PO TABS: 800.0000 mg | ORAL_TABLET | Freq: Three times a day (TID) | ORAL | 0 refills | Status: AC

## 2019-01-10 NOTE — Discharge Summary (Signed)
OB Discharge Summary     Patient Name: Stacy Hudson DOB: Dec 20, 1995 MRN: [id number]  Date of admission: 01/06/2019 Delivering MD: Waymon Amato   Date of discharge: 01/10/2019  Admitting diagnosis: IOL for BMI Intrauterine pregnancy: [redacted]w[redacted]d     Secondary diagnosis:  Principal Problem:   Postpartum care following cesarean delivery 9/22 Active Problems:   Obesity complicating pregnancy, third trimester   Cesarean delivery delivered   Leukocytosis  Additional problems: None     Discharge diagnosis: Term Pregnancy Delivered                                                                                                Post partum procedures:Antibiotics x24 hours  Augmentation: Pitocin, Cytotec and Foley Balloon  Complications: Intrauterine Inflammation or infection (Chorioamniotis)  Hospital course:  Induction of Labor With Cesarean Section  23 y.o. yo G1P1001 at [redacted]w[redacted]d was admitted to the hospital 01/06/2019 for induction of labor. Patient had a labor course significant for intrauterine infection. The patient went for cesarean section due to Chorioamnionitis w/ non-reassuring fetal surveillance, and delivered a Viable infant,01/07/2019  Membrane Rupture Time/Date: 2:10 PM ,01/06/2019   Details of operation can be found in separate operative Note.  Patient had an uncomplicated postpartum course. She is ambulating, tolerating a regular diet, passing flatus, and urinating well.  Patient is discharged home in stable condition on 01/10/19.                                    Physical exam  Vitals:   01/09/19 0904 01/09/19 1521 01/09/19 2300 01/10/19 0535  BP:  (!) 109/57 (!) 100/49 115/60  Pulse:  (!) 105 96 (!) 112  Resp:  18 18 19   Temp: 98.9 F (37.2 C) 98.3 F (36.8 C) 98.7 F (37.1 C) 98.2 F (36.8 C)  TempSrc: Oral Oral Oral Oral  SpO2:   98%   Weight:      Height:       General: alert, cooperative and no distress Lochia: appropriate Uterine Fundus: firm,  non-tender Incision: Healing well with no significant drainage, Dressing is clean, dry, and intact DVT Evaluation: No evidence of DVT seen on physical exam. No cords or calf tenderness. No significant calf/ankle edema. Labs: Lab Results  Component Value Date   WBC 35.1 (H) 01/08/2019   HGB 10.9 (L) 01/08/2019   HCT 33.2 (L) 01/08/2019   MCV 86.5 01/08/2019   PLT 219 01/08/2019   CMP Latest Ref Rng & Units 01/08/2019  Glucose 65 - 99 mg/dL -  BUN 7 - 25 mg/dL -  Creatinine 0.44 - 1.00 mg/dL 0.90  Sodium 135 - 146 mmol/L -  Potassium 3.5 - 5.3 mmol/L -  Chloride 98 - 110 mmol/L -  CO2 20 - 32 mmol/L -  Calcium 8.6 - 10.2 mg/dL -  Total Protein 6.1 - 8.1 g/dL -  Total Bilirubin 0.2 - 1.2 mg/dL -  Alkaline Phos 39 - 117 U/L -  AST 10 - 30 U/L -  ALT 6 -  29 U/L -    Discharge instruction: per After Visit Summary and "Baby and Me Booklet".  After visit meds:  Allergies as of 01/10/2019      Reactions   Penicillins Hives   Did it involve swelling of the face/tongue/throat, SOB, or low BP? No Did it involve sudden or severe rash/hives, skin peeling, or any reaction on the inside of your mouth or nose? Yes Did you need to seek medical attention at a hospital or doctor's office? Yes When did it last happen?childhood If all above answers are "NO", may proceed with cephalosporin use.   Rocephin [ceftriaxone Sodium In Dextrose] Nausea And Vomiting      Medication List    STOP taking these medications   acetaminophen 500 MG tablet Commonly known as: TYLENOL   prenatal multivitamin Tabs tablet   valACYclovir 1000 MG tablet Commonly known as: VALTREX     TAKE these medications   ibuprofen 800 MG tablet Commonly known as: ADVIL Take 1 tablet (800 mg total) by mouth every 8 (eight) hours.   oxyCODONE 5 MG immediate release tablet Commonly known as: Oxy IR/ROXICODONE Take 1 tablet (5 mg total) by mouth every 4 (four) hours as needed for moderate pain.             Discharge Care Instructions  (From admission, onward)         Start     Ordered   01/10/19 0000  Discharge wound care:    Comments: May remove dressing on Day 7 after surgery. Keep area clean and dry. Call for foul smelling discharge/drainage, pain not relieved with medication, area that is warm to the touch.   01/10/19 2330          Diet: routine diet  Activity: Advance as tolerated. Pelvic rest for 6 weeks.   Outpatient follow up:6 weeks Follow up Appt:No future appointments. Follow up Visit:No follow-ups on file.  Postpartum contraception: Combination OCPs and at 6 week visit  Newborn Data: Live born female  Birth Weight: 8 lb 11.3 oz (3950 g) APGAR: 8, 9  Newborn Delivery   Birth date/time: 01/07/2019 22:16:00 Delivery type: C-Section, Low Transverse Trial of labor: No C-section categorization: Primary      Baby Feeding: Bottle Disposition:home with mother  .Rhea Pink, MSN, CNM 01/10/2019 8:22 AM

## 2019-01-10 NOTE — Progress Notes (Signed)
CSW received consult for [place] 11 and, per RN judgement, patient is having a hard time coping with adjustment of new baby and needs. Seemingly stressed out and anxious. CSW met with MOB to offer support and complete assessment.    MOB resting in bed breastfeeding infant with FOB walking around and packing up room, when CSW entered the room. CSW explained reason for return to which MOB and FOB expressed understanding. CSW inquired about how MOB was feeling and MOB reported much better than the first day. Per MOB, she feels better after learning about different options for feeding the infant, getting more practice with swaddling and changing infant's diapers. MOB appeared to be in better spirits than first meeting with CSW. MOB stated she feels they are starting to get into a routine. MOB did share with CSW that she and FOB have reached out to each of their mother's and one of them is going to stay with them when they get home to help out. MOB aware she can reach out for help and feels more comfortable doing so now. MOB also shared that her doctors have encouraged her to reach out if her anxiety gets worse and she feels comfortable doing that. MOB denied any further questions, concerns or need for resources from CSW at this time.   CSW identifies no further need for intervention and no barriers to discharge at this time.  Elijio Miles, [place]  [facility] [employer] [phone]

## 2019-01-10 NOTE — Lactation Note (Signed)
This note was copied from a baby's chart. Lactation Consultation Note  Patient Name: Stacy Hudson [id number] Date: 01/10/2019 Reason for consult: Term;Primapara;1st time breastfeeding  P1 mother whose infant is now 67 hours old.    Baby was swaddled and asleep in bassinet when I arrived.  Since it had been 4 hours since she last fed I offered to assist with latching.  Mother accepted.  Mother has still had some difficulty with latching and feeding.  Asked mother to demonstrate hand expression.  She was able to express a couple of colostrum drops.  Mother stated her breasts feel a little heavier today.  Assisted baby to latch in the football hold to the left breast.  Baby remained sleepy and needed gentle stimulation to latch.  Once latched, she continued to need stimulation to stay awake during feeding.  Mother needed reminders on finger/hand placement and breast compressions.  She needs to remember to get baby to latch deeply into breast tissue.  Mother observed baby deeply latched and denied pain with latching.    Cautioned mother about pacifier use since I observed a pacifier in her bassinet.  Also discussed bottle feeding with EBM and when to begin.  Mother felt strong uterine contractions during latching and I reassured her that this was a positive sign of a good latch.  Mother verbalized understanding.  Mother has a DEBP for home use.  She also has our OP phone number for questions/concerns after discharge.  Father present.  RN finished with discharge teaching.  Mother appreciative of help.   Maternal Data Formula Feeding for Exclusion: No Has patient been taught Hand Expression?: Yes Does the patient have breastfeeding experience prior to this delivery?: No  Feeding Feeding Type: Breast Fed  LATCH Score Latch: Repeated attempts needed to sustain latch, nipple held in mouth throughout feeding, stimulation needed to elicit sucking reflex.  Audible Swallowing: A few with  stimulation  Type of Nipple: Everted at rest and after stimulation  Comfort (Breast/Nipple): Soft / non-tender  Hold (Positioning): Assistance needed to correctly position infant at breast and maintain latch.  LATCH Score: 7  Interventions Interventions: Breast feeding basics reviewed;Assisted with latch;Skin to skin;Breast massage;Hand express;Breast compression;Comfort gels;Coconut oil;Position options;Support pillows;Adjust position;DEBP  Lactation Tools Discussed/Used     Consult Status Consult Status: Complete Date: 01/10/19 Follow-up type: Call as needed    Lanice Schwab Zarah Carbon 01/10/2019, 12:25 PM

## 2019-01-10 NOTE — Lactation Note (Signed)
This note was copied from a baby's chart. Lactation Consultation Note  Patient Name: Stacy Hudson [id number] Date: 01/10/2019 Reason for consult: Follow-up assessment   LC Follow Up Visit:  Attempted to visit with mother, however, she was sleeping.  Father will call when mother awakens.    Consult Status Consult Status: Follow-up Date: 01/10/19 Follow-up type: In-patient    Kiaraliz Rafuse R Collier Monica 01/10/2019, 10:36 AM

## 2019-01-10 NOTE — Progress Notes (Signed)
CSW aware of consult and attempted to meet with MOB at bedside. However, MOB noted to be asleep. CSW will attempt to meet with MOB at a later time.  Elijio Miles, [place]  [facility] [employer] [phone]

## 2019-02-05 ENCOUNTER — Encounter (HOSPITAL_COMMUNITY): Payer: Self-pay | Admitting: Obstetrics & Gynecology

## 2021-04-19 ENCOUNTER — Other Ambulatory Visit: Payer: Self-pay

## 2021-08-04 ENCOUNTER — Other Ambulatory Visit: Payer: Self-pay | Admitting: Internal Medicine

## 2021-08-04 DIAGNOSIS — R4189 Other symptoms and signs involving cognitive functions and awareness: Secondary | ICD-10-CM

## 2021-08-04 DIAGNOSIS — R519 Headache, unspecified: Secondary | ICD-10-CM

## 2021-08-04 DIAGNOSIS — E236 Other disorders of pituitary gland: Secondary | ICD-10-CM

## 2021-08-26 ENCOUNTER — Other Ambulatory Visit: Payer: Medicaid Other

## 2021-08-27 ENCOUNTER — Ambulatory Visit
Admission: RE | Admit: 2021-08-27 | Discharge: 2021-08-27 | Disposition: A | Discharge status: Home / Self Care | Payer: No Typology Code available for payment source | Source: Ambulatory Visit | Attending: Internal Medicine | Admitting: Internal Medicine

## 2021-08-27 DIAGNOSIS — R4189 Other symptoms and signs involving cognitive functions and awareness: Secondary | ICD-10-CM

## 2021-08-27 DIAGNOSIS — E236 Other disorders of pituitary gland: Secondary | ICD-10-CM

## 2021-08-27 DIAGNOSIS — R519 Headache, unspecified: Secondary | ICD-10-CM

## 2021-08-27 MED ORDER — GADOBENATE DIMEGLUMINE 529 MG/ML IV SOLN
20.0000 mL | Freq: Once | INTRAVENOUS | Status: AC | PRN
  Administered 2021-08-27: 20 mL via INTRAVENOUS

## 2022-06-15 ENCOUNTER — Other Ambulatory Visit: Payer: Self-pay | Admitting: Obstetrics & Gynecology

## 2022-06-15 DIAGNOSIS — N921 Excessive and frequent menstruation with irregular cycle: Secondary | ICD-10-CM

## 2022-07-12 ENCOUNTER — Ambulatory Visit
Admission: RE | Admit: 2022-07-12 | Discharge: 2022-07-12 | Discharge status: Home / Self Care | Payer: No Typology Code available for payment source | Source: Ambulatory Visit | Attending: Obstetrics & Gynecology

## 2022-07-12 DIAGNOSIS — N921 Excessive and frequent menstruation with irregular cycle: Secondary | ICD-10-CM

## 2022-07-13 ENCOUNTER — Other Ambulatory Visit: Payer: No Typology Code available for payment source
